# Patient Record
Sex: Male | Born: 1965 | Race: White | Hispanic: No | Marital: Single | Smoking: Current every day smoker
Health system: Southern US, Community
[De-identification: ages and names within clinical notes are randomized; demographics above are authoritative.]

## PROBLEM LIST (undated history)

## (undated) DIAGNOSIS — R110 Nausea: Secondary | ICD-10-CM

## (undated) DIAGNOSIS — C7951 Secondary malignant neoplasm of bone: Secondary | ICD-10-CM

## (undated) DIAGNOSIS — J302 Other seasonal allergic rhinitis: Secondary | ICD-10-CM

## (undated) DIAGNOSIS — M62838 Other muscle spasm: Secondary | ICD-10-CM

## (undated) DIAGNOSIS — G909 Disorder of the autonomic nervous system, unspecified: Secondary | ICD-10-CM

## (undated) DIAGNOSIS — K648 Other hemorrhoids: Principal | ICD-10-CM

## (undated) DIAGNOSIS — B20 Human immunodeficiency virus [HIV] disease: Secondary | ICD-10-CM

## (undated) DIAGNOSIS — C20 Malignant neoplasm of rectum: Secondary | ICD-10-CM

## (undated) DIAGNOSIS — K219 Gastro-esophageal reflux disease without esophagitis: Secondary | ICD-10-CM

## (undated) DIAGNOSIS — I214 Non-ST elevation (NSTEMI) myocardial infarction: Secondary | ICD-10-CM

## (undated) DIAGNOSIS — E876 Hypokalemia: Secondary | ICD-10-CM

## (undated) HISTORY — DX: Disorder of the autonomic nervous system, unspecified: G90.9

## (undated) HISTORY — DX: Human immunodeficiency virus (HIV) disease: B20

## (undated) HISTORY — DX: Nausea: R11.0

## (undated) HISTORY — DX: Other muscle spasm: M62.838

## (undated) HISTORY — DX: Non-ST elevation (NSTEMI) myocardial infarction: I21.4

## (undated) HISTORY — DX: Secondary malignant neoplasm of bone: C79.51

## (undated) HISTORY — DX: Other hemorrhoids: K64.8

## (undated) HISTORY — DX: Other seasonal allergic rhinitis: J30.2

## (undated) HISTORY — DX: Malignant neoplasm of rectum: C20

## (undated) HISTORY — DX: Hypokalemia: E87.6

## (undated) HISTORY — DX: Gastro-esophageal reflux disease without esophagitis: K21.9

---

## 1999-10-17 ENCOUNTER — Encounter: Admission: RE | Admit: 1999-10-17 | Discharge: 1999-10-17 | Payer: Self-pay | Admitting: Hematology and Oncology

## 1999-10-17 ENCOUNTER — Ambulatory Visit (HOSPITAL_COMMUNITY): Admission: RE | Admit: 1999-10-17 | Discharge: 1999-10-17 | Payer: Self-pay | Admitting: Hematology and Oncology

## 1999-11-05 ENCOUNTER — Ambulatory Visit (HOSPITAL_COMMUNITY): Admission: RE | Admit: 1999-11-05 | Discharge: 1999-11-05 | Payer: Self-pay | Admitting: Internal Medicine

## 1999-11-05 ENCOUNTER — Encounter: Admission: RE | Admit: 1999-11-05 | Discharge: 1999-11-05 | Payer: Self-pay | Admitting: Internal Medicine

## 1999-11-06 ENCOUNTER — Ambulatory Visit (HOSPITAL_COMMUNITY): Admission: RE | Admit: 1999-11-06 | Discharge: 1999-11-06 | Payer: Self-pay | Admitting: Internal Medicine

## 1999-11-06 ENCOUNTER — Encounter: Payer: Self-pay | Admitting: Internal Medicine

## 1999-12-05 ENCOUNTER — Encounter: Admission: RE | Admit: 1999-12-05 | Discharge: 1999-12-05 | Payer: Self-pay | Admitting: Internal Medicine

## 2000-01-24 ENCOUNTER — Encounter: Admission: RE | Admit: 2000-01-24 | Discharge: 2000-01-24 | Payer: Self-pay | Admitting: Internal Medicine

## 2000-01-24 ENCOUNTER — Ambulatory Visit (HOSPITAL_COMMUNITY): Admission: RE | Admit: 2000-01-24 | Discharge: 2000-01-24 | Payer: Self-pay | Admitting: Internal Medicine

## 2000-02-28 ENCOUNTER — Encounter: Admission: RE | Admit: 2000-02-28 | Discharge: 2000-02-28 | Payer: Self-pay | Admitting: Internal Medicine

## 2000-03-10 ENCOUNTER — Encounter: Admission: RE | Admit: 2000-03-10 | Discharge: 2000-03-10 | Payer: Self-pay | Admitting: Internal Medicine

## 2000-03-24 ENCOUNTER — Encounter: Admission: RE | Admit: 2000-03-24 | Discharge: 2000-03-24 | Payer: Self-pay | Admitting: Internal Medicine

## 2000-03-26 ENCOUNTER — Encounter: Admission: RE | Admit: 2000-03-26 | Discharge: 2000-03-26 | Payer: Self-pay | Admitting: Internal Medicine

## 2000-05-07 ENCOUNTER — Ambulatory Visit (HOSPITAL_COMMUNITY): Admission: RE | Admit: 2000-05-07 | Discharge: 2000-05-07 | Payer: Self-pay | Admitting: Internal Medicine

## 2000-05-07 ENCOUNTER — Encounter: Admission: RE | Admit: 2000-05-07 | Discharge: 2000-05-07 | Payer: Self-pay | Admitting: Internal Medicine

## 2000-08-10 ENCOUNTER — Emergency Department (HOSPITAL_COMMUNITY): Admission: EM | Admit: 2000-08-10 | Discharge: 2000-08-10 | Payer: Self-pay | Admitting: Emergency Medicine

## 2000-08-25 ENCOUNTER — Encounter: Admission: RE | Admit: 2000-08-25 | Discharge: 2000-08-25 | Payer: Self-pay | Admitting: Internal Medicine

## 2000-08-25 ENCOUNTER — Ambulatory Visit (HOSPITAL_COMMUNITY): Admission: RE | Admit: 2000-08-25 | Discharge: 2000-08-25 | Payer: Self-pay | Admitting: Internal Medicine

## 2015-07-05 ENCOUNTER — Other Ambulatory Visit: Payer: Self-pay

## 2015-07-10 ENCOUNTER — Other Ambulatory Visit: Payer: Self-pay

## 2015-07-10 DIAGNOSIS — B20 Human immunodeficiency virus [HIV] disease: Secondary | ICD-10-CM

## 2015-07-12 ENCOUNTER — Other Ambulatory Visit: Payer: Self-pay

## 2015-07-12 DIAGNOSIS — B20 Human immunodeficiency virus [HIV] disease: Secondary | ICD-10-CM

## 2015-07-12 LAB — CBC WITH DIFFERENTIAL/PLATELET
BASOS ABS: 0 {cells}/uL (ref 0–200)
Basophils Relative: 0 %
EOS PCT: 1 %
Eosinophils Absolute: 62 cells/uL (ref 15–500)
HCT: 44 % (ref 38.5–50.0)
Hemoglobin: 14.7 g/dL (ref 13.2–17.1)
LYMPHS PCT: 40 %
Lymphs Abs: 2480 cells/uL (ref 850–3900)
MCH: 30.8 pg (ref 27.0–33.0)
MCHC: 33.4 g/dL (ref 32.0–36.0)
MCV: 92.2 fL (ref 80.0–100.0)
MONOS PCT: 5 %
MPV: 12.4 fL (ref 7.5–12.5)
Monocytes Absolute: 310 cells/uL (ref 200–950)
NEUTROS PCT: 54 %
Neutro Abs: 3348 cells/uL (ref 1500–7800)
PLATELETS: 215 10*3/uL (ref 140–400)
RBC: 4.77 MIL/uL (ref 4.20–5.80)
RDW: 14.4 % (ref 11.0–15.0)
WBC: 6.2 10*3/uL (ref 3.8–10.8)

## 2015-07-12 LAB — COMPLETE METABOLIC PANEL WITH GFR
ALK PHOS: 54 U/L (ref 40–115)
ALT: 13 U/L (ref 9–46)
AST: 17 U/L (ref 10–40)
Albumin: 4.3 g/dL (ref 3.6–5.1)
BILIRUBIN TOTAL: 0.4 mg/dL (ref 0.2–1.2)
BUN: 9 mg/dL (ref 7–25)
CO2: 22 mmol/L (ref 20–31)
Calcium: 9.3 mg/dL (ref 8.6–10.3)
Chloride: 102 mmol/L (ref 98–110)
Creat: 0.86 mg/dL (ref 0.60–1.35)
GLUCOSE: 81 mg/dL (ref 65–99)
POTASSIUM: 3.9 mmol/L (ref 3.5–5.3)
SODIUM: 137 mmol/L (ref 135–146)
Total Protein: 6.9 g/dL (ref 6.1–8.1)

## 2015-07-12 LAB — LIPID PANEL
CHOL/HDL RATIO: 3.5 ratio (ref <=5.0)
Cholesterol: 180 mg/dL (ref 125–200)
HDL: 51 mg/dL (ref >=40)
LDL CALC: 102 mg/dL (ref <130)
Triglycerides: 137 mg/dL (ref <150)
VLDL: 27 mg/dL (ref <30)

## 2015-07-13 LAB — URINALYSIS
BILIRUBIN URINE: NEGATIVE
GLUCOSE, UA: NEGATIVE
HGB URINE DIPSTICK: NEGATIVE
KETONES UR: NEGATIVE
LEUKOCYTES UA: NEGATIVE
Nitrite: NEGATIVE
PH: 6 (ref 5.0–8.0)
Protein, ur: NEGATIVE
Specific Gravity, Urine: 1.012 (ref 1.001–1.035)

## 2015-07-13 LAB — HEPATITIS B SURFACE ANTIBODY,QUALITATIVE: Hep B S Ab: POSITIVE — AB

## 2015-07-13 LAB — HEPATITIS C ANTIBODY: HCV Ab: NEGATIVE

## 2015-07-13 LAB — HEPATITIS B CORE ANTIBODY, TOTAL: HEP B C TOTAL AB: REACTIVE — AB

## 2015-07-13 LAB — HEPATITIS A ANTIBODY, TOTAL: HEP A TOTAL AB: REACTIVE — AB

## 2015-07-13 LAB — T-HELPER CELL (CD4) - (RCID CLINIC ONLY)
CD4 % Helper T Cell: 33 % (ref 33–55)
CD4 T CELL ABS: 830 /uL (ref 400–2700)

## 2015-07-13 LAB — HEPATITIS B SURFACE ANTIGEN: Hepatitis B Surface Ag: NEGATIVE

## 2015-07-13 LAB — RPR

## 2015-07-14 LAB — QUANTIFERON TB GOLD ASSAY (BLOOD)
INTERFERON GAMMA RELEASE ASSAY: NEGATIVE
QUANTIFERON NIL VALUE: 0.06 [IU]/mL
QUANTIFERON TB AG MINUS NIL: 0.2 [IU]/mL

## 2015-07-17 LAB — URINE CYTOLOGY ANCILLARY ONLY
CHLAMYDIA, DNA PROBE: NEGATIVE
NEISSERIA GONORRHEA: NEGATIVE

## 2015-07-18 ENCOUNTER — Ambulatory Visit: Payer: Self-pay | Admitting: Infectious Diseases

## 2015-07-18 LAB — HIV-1 RNA ULTRAQUANT REFLEX TO GENTYP+
HIV 1 RNA Quant: <20 copies/mL (ref <20)
HIV-1 RNA Quant, Log: <1.3 Log copies/mL (ref <1.30)

## 2015-07-18 LAB — HLA B*5701: HLA-B*5701 w/rflx HLA-B High: NEGATIVE

## 2015-08-20 ENCOUNTER — Ambulatory Visit (INDEPENDENT_AMBULATORY_CARE_PROVIDER_SITE_OTHER): Payer: Self-pay | Admitting: Infectious Disease

## 2015-08-20 ENCOUNTER — Encounter: Payer: Self-pay | Admitting: Infectious Disease

## 2015-08-20 VITALS — BP 142/91 | HR 85 | Temp 98.3°F | Wt 180.0 lb

## 2015-08-20 DIAGNOSIS — E876 Hypokalemia: Secondary | ICD-10-CM

## 2015-08-20 DIAGNOSIS — B20 Human immunodeficiency virus [HIV] disease: Secondary | ICD-10-CM

## 2015-08-20 DIAGNOSIS — Z113 Encounter for screening for infections with a predominantly sexual mode of transmission: Secondary | ICD-10-CM

## 2015-08-20 DIAGNOSIS — M62838 Other muscle spasm: Secondary | ICD-10-CM

## 2015-08-20 DIAGNOSIS — J302 Other seasonal allergic rhinitis: Secondary | ICD-10-CM

## 2015-08-20 DIAGNOSIS — G909 Disorder of the autonomic nervous system, unspecified: Secondary | ICD-10-CM

## 2015-08-20 DIAGNOSIS — K219 Gastro-esophageal reflux disease without esophagitis: Secondary | ICD-10-CM

## 2015-08-20 HISTORY — DX: Other muscle spasm: M62.838

## 2015-08-20 HISTORY — DX: Other seasonal allergic rhinitis: J30.2

## 2015-08-20 HISTORY — DX: Disorder of the autonomic nervous system, unspecified: G90.9

## 2015-08-20 HISTORY — DX: Human immunodeficiency virus (HIV) disease: B20

## 2015-08-20 HISTORY — DX: Gastro-esophageal reflux disease without esophagitis: K21.9

## 2015-08-20 HISTORY — DX: Hypokalemia: E87.6

## 2015-08-20 LAB — CBC WITH DIFFERENTIAL/PLATELET
BASOS ABS: 0 {cells}/uL (ref 0–200)
Basophils Relative: 0 %
Eosinophils Absolute: 201 cells/uL (ref 15–500)
Eosinophils Relative: 3 %
HEMATOCRIT: 44.4 % (ref 38.5–50.0)
HEMOGLOBIN: 15.3 g/dL (ref 13.2–17.1)
LYMPHS ABS: 2479 {cells}/uL (ref 850–3900)
Lymphocytes Relative: 37 %
MCH: 31.2 pg (ref 27.0–33.0)
MCHC: 34.5 g/dL (ref 32.0–36.0)
MCV: 90.6 fL (ref 80.0–100.0)
MONO ABS: 402 {cells}/uL (ref 200–950)
MPV: 12.6 fL — ABNORMAL HIGH (ref 7.5–12.5)
Monocytes Relative: 6 %
NEUTROS PCT: 54 %
Neutro Abs: 3618 cells/uL (ref 1500–7800)
Platelets: 189 10*3/uL (ref 140–400)
RBC: 4.9 MIL/uL (ref 4.20–5.80)
RDW: 13.6 % (ref 11.0–15.0)
WBC: 6.7 10*3/uL (ref 3.8–10.8)

## 2015-08-20 LAB — COMPLETE METABOLIC PANEL WITH GFR
ALT: 14 U/L (ref 9–46)
AST: 19 U/L (ref 10–40)
Albumin: 4 g/dL (ref 3.6–5.1)
Alkaline Phosphatase: 43 U/L (ref 40–115)
BUN: 14 mg/dL (ref 7–25)
CHLORIDE: 106 mmol/L (ref 98–110)
CO2: 24 mmol/L (ref 20–31)
Calcium: 9.1 mg/dL (ref 8.6–10.3)
Creat: 0.82 mg/dL (ref 0.60–1.35)
GFR, Est African American: >89 mL/min (ref >=60)
GFR, Est Non African American: >89 mL/min (ref >=60)
GLUCOSE: 85 mg/dL (ref 65–99)
POTASSIUM: 4.2 mmol/L (ref 3.5–5.3)
SODIUM: 141 mmol/L (ref 135–146)
Total Bilirubin: 0.4 mg/dL (ref 0.2–1.2)
Total Protein: 6.4 g/dL (ref 6.1–8.1)

## 2015-08-20 MED ORDER — GABAPENTIN 600 MG PO TABS: 1200.0000 mg | ORAL_TABLET | Freq: Two times a day (BID) | ORAL | Status: DC

## 2015-08-20 MED ORDER — EMTRICITABINE-TENOFOVIR AF 200-25 MG PO TABS: 1.0000 | ORAL_TABLET | Freq: Every day | ORAL | Status: DC

## 2015-08-20 MED ORDER — DARUNAVIR-COBICISTAT 800-150 MG PO TABS: 1.0000 | ORAL_TABLET | Freq: Every day | ORAL | Status: DC

## 2015-08-20 NOTE — Patient Instructions (Signed)
We will do [place] if your ADAP is not done yet  You also need to renew the ADAP you just applied for since it is July  We will get blood work today  I want you to make an appt with [facility] or [place]) in 2 weeks  Repeat blood work in 4 weeks and appt to see me in 5-6 weeks

## 2015-08-20 NOTE — Progress Notes (Signed)
Chief complaint: establish care for his HIV disease here at [facility]  Subjective:    Patient ID: Kevin Conrad, male    DOB: 1966-01-31, 50 y.o.   MRN: [id number]  HPI  50 year old man with HIV disease, formerly AIDS who had been incarcerated for 18 years recently released from prison. He had been well controlled on Prezista, Norvir, Viread and 3TC x 10 years. He states that he was initially on AZT monotherapy, later boosted reyataz but he does not recall of his regimens. His VL was <20 5 days after running out of his meds.   Lab Results  Component Value Date   HIV1RNAQUANT <20 07/12/2015   Lab Results  Component Value Date   CD4TABS 830 07/12/2015   He has severe HIV neuropathy for which he take gabapentin 1200mg  BID.  Past Medical History  Diagnosis Date  . AIDS ([place]) 08/20/2015  . 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  No family history on file.    Social History   Social History  . Marital Status: Single    Spouse Name: N/A  . Number of Children: N/A  . Years of Education: N/A   Social History Main Topics  . Smoking status: Current Every Day Smoker -- 0.50 packs/day    Types: Cigarettes  . Smokeless tobacco: Not on file  . Alcohol Use: No  . Drug Use: No  . Sexual Activity: No     Comment: pt declined   Other Topics Concern  . Not on file   Social History Narrative  . No narrative on file    Allergies  Allergen Reactions  . Shellfish Allergy Anaphylaxis     Current outpatient prescriptions:  .  cetirizine (ZYRTEC) 10 MG tablet, Take 10 mg by mouth daily., Disp: , Rfl:  .  clobetasol cream (TEMOVATE) AB-123456789 %, Apply 1 application topically daily., Disp: , Rfl:  .  cyclobenzaprine (FLEXERIL) 5 MG tablet, Take 5 mg by mouth 3 (three) times daily., Disp: , Rfl:  .  furosemide (LASIX) 10 MG/ML solution, Take 20 mg by mouth daily., Disp: , Rfl:  .  gabapentin (NEURONTIN) 600 MG tablet, Take 2 tablets (1,200 mg  total) by mouth 2 (two) times daily., Disp: 120 tablet, Rfl: 4 .  lisinopril (PRINIVIL,ZESTRIL) 40 MG tablet, Take 40 mg by mouth daily., Disp: , Rfl:  .  omeprazole (PRILOSEC) 20 MG capsule, Take 20 mg by mouth daily., Disp: , Rfl:  .  potassium chloride (K-DUR,KLOR-CON) 10 MEQ tablet, Take 10 mEq by mouth once., Disp: , Rfl:  .  darunavir-cobicistat (PREZCOBIX) 800-150 MG tablet, Take 1 tablet by mouth daily., Disp: 30 tablet, Rfl: 11 .  emtricitabine-tenofovir AF (DESCOVY) 200-25 MG tablet, Take 1 tablet by mouth daily., Disp: 30 tablet, Rfl: 11    Review of Systems  Constitutional: Negative for fever, chills, diaphoresis, activity change, appetite change, fatigue and unexpected weight change.  HENT: Negative for congestion, rhinorrhea, sinus pressure, sneezing, sore throat and trouble swallowing.   Eyes: Negative for photophobia and visual disturbance.  Respiratory: Negative for cough, chest tightness, shortness of breath, wheezing and stridor.   Cardiovascular: Negative for chest pain, palpitations and leg swelling.  Gastrointestinal: Negative for nausea, vomiting, abdominal pain, diarrhea, constipation, blood in stool, abdominal distention and anal bleeding.  Genitourinary: Negative for dysuria, hematuria, flank pain and difficulty urinating.  Musculoskeletal: Negative for myalgias, back pain, joint swelling, arthralgias and gait problem.  Skin: Negative for color  change, pallor, rash and wound.  Neurological: Positive for numbness. Negative for dizziness, tremors, weakness and light-headedness.  Hematological: Negative for adenopathy. Does not bruise/bleed easily.  Psychiatric/Behavioral: Negative for behavioral problems, confusion, sleep disturbance, dysphoric mood, decreased concentration and agitation.       Objective:   Physical Exam  Constitutional: He is oriented to person, place, and time. He appears well-developed and well-nourished.  HENT:  Head: Normocephalic and  atraumatic.  Eyes: Conjunctivae and EOM are normal.  Neck: Normal range of motion. Neck supple.  Cardiovascular: Normal rate and regular rhythm.   Pulmonary/Chest: Effort normal. No respiratory distress. He has no wheezes.  Abdominal: Soft. He exhibits no distension.  Musculoskeletal: Normal range of motion. He exhibits no edema or tenderness.  Neurological: He is alert and oriented to person, place, and time.  Skin: Skin is warm and dry. No rash noted. No erythema. No pallor.  Psychiatric: He has a normal mood and affect. His behavior is normal. Judgment and thought content normal.          Assessment & Plan:   HIV disease:  I will place him on Prezcobix and Truvada until we can ensure ADAP renewal and change to Prezcobix plus Descovy  HIV neuropathy: continue gabapentin  GERD: continue PPI  I spent greater than 45 minutes with the patient including greater than 50% of time in face to face counsel of the patient re his HIV, his neuropathy, GERD and in coordination of his care.

## 2015-08-21 LAB — RPR

## 2015-08-22 LAB — T-HELPER CELL (CD4) - (RCID CLINIC ONLY)
CD4 % Helper T Cell: 34 % (ref 33–55)
CD4 T Cell Abs: 850 /uL (ref 400–2700)

## 2015-08-23 LAB — HIV-1 RNA ULTRAQUANT REFLEX TO GENTYP+
HIV 1 RNA QUANT: 36 {copies}/mL — AB (ref <20)
HIV-1 RNA QUANT, LOG: 1.56 {Log_copies}/mL — AB (ref <1.30)

## 2015-08-30 LAB — HLA B*5701: HLA-B*5701 w/rflx HLA-B High: NEGATIVE

## 2015-09-03 ENCOUNTER — Ambulatory Visit: Payer: Self-pay

## 2015-09-03 ENCOUNTER — Ambulatory Visit: Payer: Self-pay | Admitting: Pharmacist

## 2015-09-03 VITALS — Ht 65.0 in

## 2015-09-03 DIAGNOSIS — B20 Human immunodeficiency virus [HIV] disease: Secondary | ICD-10-CM

## 2015-09-03 MED ORDER — EMTRICITABINE-TENOFOVIR AF 200-25 MG PO TABS: 1.0000 | ORAL_TABLET | Freq: Every day | ORAL | Status: DC

## 2015-09-03 MED ORDER — DARUNAVIR-COBICISTAT 800-150 MG PO TABS: 1.0000 | ORAL_TABLET | Freq: Every day | ORAL | Status: DC

## 2015-09-03 MED ORDER — POTASSIUM CHLORIDE CRYS ER 10 MEQ PO TBCR: 10.0000 meq | EXTENDED_RELEASE_TABLET | Freq: Once | ORAL | Status: AC

## 2015-09-03 MED ORDER — GABAPENTIN 600 MG PO TABS: 1200.0000 mg | ORAL_TABLET | Freq: Two times a day (BID) | ORAL | Status: DC

## 2015-09-03 MED ORDER — LISINOPRIL 40 MG PO TABS: 40.0000 mg | ORAL_TABLET | Freq: Every day | ORAL | Status: DC

## 2015-09-03 MED ORDER — OMEPRAZOLE 20 MG PO CPDR: 20.0000 mg | DELAYED_RELEASE_CAPSULE | Freq: Every day | ORAL | Status: DC

## 2015-09-03 MED ORDER — FUROSEMIDE 10 MG/ML PO SOLN: 20.0000 mg | Freq: Every day | ORAL | Status: AC

## 2015-09-03 NOTE — Progress Notes (Signed)
HPI: Kevin Conrad is a 50 y.o. male who presents to the [facility] today for follow up of his HIV infection.  He was recently released from jail and has started Prezcobix + Descovy.  He is not having any issues tolerating his medications.  He did have nausea for around 10-12 days when first starting but it has resolved since then.  Allergies: Allergies  Allergen Reactions  . Shellfish Allergy Anaphylaxis    Vitals:    Past Medical History: Past Medical History  Diagnosis Date  . AIDS ([place]) 08/20/2015  . 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 GERD (gastroesophageal reflux disease) 08/20/2015  . Muscle spasm 08/20/2015  . Hypokalemia 08/20/2015  . Seasonal allergies 08/20/2015    Social History: Social History   Social History  . Marital Status: Single    Spouse Name: N/A  . Number of Children: N/A  . Years of Education: N/A   Social History Main Topics  . Smoking status: Current Every Day Smoker -- 0.50 packs/day    Types: Cigarettes  . Smokeless tobacco: Not on file  . Alcohol Use: No  . Drug Use: No  . Sexual Activity: No     Comment: pt declined   Other Topics Concern  . Not on file   Social History Narrative  . No narrative on file    Labs: HIV 1 RNA QUANT (copies/mL)  Date Value  08/20/2015 36*  07/12/2015 <20   CD4 T CELL ABS (/uL)  Date Value  08/20/2015 850  07/12/2015 830   HEP B S AB (no units)  Date Value  07/12/2015 POS*   HEPATITIS B SURFACE AG (no units)  Date Value  07/12/2015 NEGATIVE   HCV AB (no units)  Date Value  07/12/2015 NEGATIVE    CrCl: CrCl cannot be calculated (Unknown ideal weight.).  Lipids:    Component Value Date/Time   CHOL 180 07/12/2015 1628   TRIG 137 07/12/2015 1628   HDL 51 07/12/2015 1628   CHOLHDL 3.5 07/12/2015 1628   VLDL 27 07/12/2015 1628   LDLCALC 102 07/12/2015 1628    Assessment: Kevin Conrad is here today for follow up of his HIV.  He is on Prezcobix and Descovy.  He is  currently not having an issues since the nausea resolved after the first 1-2 weeks of taking the medications. He cannot remember the names of his HIV medications, but he can point them out to me on the chart.  He did ask where to pick up his medications because he will run out before seeing Dr. Tommy Medal again.  His ADAP was approved, so I sent his medications to [facility] on [address].  He has no other issues or questions for me.  Plans: - Continue Descovy 200-25 mg PO once daily - Continue Prezcobix 800-150 mg PO once daily - Will send your medications to [facility] on [address]  - Lab appointment 8/2 at 3:45pm - F/u appointment with Dr. Tommy Medal 8/16 at 4:15pm  Cassie L. Donnajean Lopes, PharmD [facility] [place] [facility] 09/03/2015, 4:46 PM

## 2015-09-06 ENCOUNTER — Encounter: Payer: Self-pay | Admitting: Infectious Disease

## 2015-09-19 ENCOUNTER — Encounter: Payer: Self-pay | Admitting: Licensed Clinical Social Worker

## 2015-09-19 ENCOUNTER — Other Ambulatory Visit: Payer: Self-pay

## 2015-09-20 ENCOUNTER — Other Ambulatory Visit (INDEPENDENT_AMBULATORY_CARE_PROVIDER_SITE_OTHER): Payer: Self-pay

## 2015-09-20 DIAGNOSIS — Z113 Encounter for screening for infections with a predominantly sexual mode of transmission: Secondary | ICD-10-CM

## 2015-09-20 DIAGNOSIS — B20 Human immunodeficiency virus [HIV] disease: Secondary | ICD-10-CM

## 2015-09-20 LAB — COMPLETE METABOLIC PANEL WITH GFR
ALBUMIN: 4.2 g/dL (ref 3.6–5.1)
ALK PHOS: 73 U/L (ref 40–115)
ALT: 13 U/L (ref 9–46)
AST: 17 U/L (ref 10–40)
BILIRUBIN TOTAL: 0.3 mg/dL (ref 0.2–1.2)
BUN: 16 mg/dL (ref 7–25)
CO2: 25 mmol/L (ref 20–31)
Calcium: 9.9 mg/dL (ref 8.6–10.3)
Chloride: 105 mmol/L (ref 98–110)
Creat: 1.01 mg/dL (ref 0.60–1.35)
GFR, EST NON AFRICAN AMERICAN: 88 mL/min (ref >=60)
GLUCOSE: 97 mg/dL (ref 65–99)
Potassium: 4.3 mmol/L (ref 3.5–5.3)
Sodium: 140 mmol/L (ref 135–146)
TOTAL PROTEIN: 6.8 g/dL (ref 6.1–8.1)

## 2015-09-20 LAB — CBC WITH DIFFERENTIAL/PLATELET
BASOS ABS: 0 {cells}/uL (ref 0–200)
Basophils Relative: 0 %
EOS ABS: 134 {cells}/uL (ref 15–500)
Eosinophils Relative: 2 %
HEMATOCRIT: 45.4 % (ref 38.5–50.0)
HEMOGLOBIN: 15.3 g/dL (ref 13.2–17.1)
LYMPHS ABS: 2278 {cells}/uL (ref 850–3900)
Lymphocytes Relative: 34 %
MCH: 30.8 pg (ref 27.0–33.0)
MCHC: 33.7 g/dL (ref 32.0–36.0)
MCV: 91.3 fL (ref 80.0–100.0)
MPV: 13 fL — ABNORMAL HIGH (ref 7.5–12.5)
Monocytes Absolute: 402 cells/uL (ref 200–950)
Monocytes Relative: 6 %
NEUTROS ABS: 3886 {cells}/uL (ref 1500–7800)
Neutrophils Relative %: 58 %
Platelets: 186 10*3/uL (ref 140–400)
RBC: 4.97 MIL/uL (ref 4.20–5.80)
RDW: 13.8 % (ref 11.0–15.0)
WBC: 6.7 10*3/uL (ref 3.8–10.8)

## 2015-09-21 LAB — URINE CYTOLOGY ANCILLARY ONLY
CHLAMYDIA, DNA PROBE: NEGATIVE
NEISSERIA GONORRHEA: NEGATIVE

## 2015-09-21 LAB — T-HELPER CELL (CD4) - (RCID CLINIC ONLY)
CD4 % Helper T Cell: 33 % (ref 33–55)
CD4 T Cell Abs: 790 /uL (ref 400–2700)

## 2015-09-21 LAB — RPR

## 2015-09-21 LAB — HIV-1 RNA ULTRAQUANT REFLEX TO GENTYP+
HIV 1 RNA Quant: <20 {copies}/mL
HIV-1 RNA Quant, Log: <1.3 {Log_copies}/mL

## 2015-10-03 ENCOUNTER — Ambulatory Visit (INDEPENDENT_AMBULATORY_CARE_PROVIDER_SITE_OTHER): Payer: Self-pay | Admitting: Infectious Disease

## 2015-10-03 ENCOUNTER — Encounter: Payer: Self-pay | Admitting: Infectious Disease

## 2015-10-03 VITALS — BP 134/88 | HR 88 | Temp 98.3°F | Wt 186.8 lb

## 2015-10-03 DIAGNOSIS — K4090 Unilateral inguinal hernia, without obstruction or gangrene, not specified as recurrent: Secondary | ICD-10-CM

## 2015-10-03 DIAGNOSIS — R197 Diarrhea, unspecified: Secondary | ICD-10-CM

## 2015-10-03 DIAGNOSIS — Z23 Encounter for immunization: Secondary | ICD-10-CM

## 2015-10-03 DIAGNOSIS — K219 Gastro-esophageal reflux disease without esophagitis: Secondary | ICD-10-CM

## 2015-10-03 DIAGNOSIS — G909 Disorder of the autonomic nervous system, unspecified: Secondary | ICD-10-CM

## 2015-10-03 DIAGNOSIS — B20 Human immunodeficiency virus [HIV] disease: Secondary | ICD-10-CM

## 2015-10-03 NOTE — Progress Notes (Signed)
Chief complaint: followup for his HIV disease  Subjective:    Patient ID: Kevin Conrad, male    DOB: 01-06-1966, 50 y.o.   MRN: [id number]  HPI  50 year old man with HIV disease, formerly AIDS who had been incarcerated for 18 years recently released from prison. He had been well controlled on Prezista, Norvir, Viread and 3TC x 10 years. He states that he was initially on AZT monotherapy, later boosted reyataz but he does not recall of his regimens. His VL was <20 5 days after running out of his meds. We placed him on Prezcobix and Truvada and then to Prezcobix and Descovy when his ADAP kicks in.   His VL is undetectable and CD4 healthy.  He has right sided hernia that hurts at night and he needs surgical repair. 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      Lab Results  Component Value Date   HIV1RNAQUANT <20 09/20/2015   HIV1RNAQUANT 36 (H) 08/20/2015   HIV1RNAQUANT <20 07/12/2015   Lab Results  Component Value Date   CD4TABS 790 09/20/2015   CD4TABS 850 08/20/2015   CD4TABS 830 07/12/2015   He has severe HIV neuropathy for which he take gabapentin 1200mg  BID.  Past Medical History:  Diagnosis Date  . AIDS ([place]) 08/20/2015  . GERD (gastroesophageal reflux disease) 08/20/2015  . 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 Hypokalemia 08/20/2015  . Muscle spasm 08/20/2015  . Seasonal allergies 08/20/2015    No past surgical history on file.  No family history on file.    Social History   Social History  . Marital status: Single    Spouse name: N/A  . Number of children: N/A  . Years of education: N/A   Social History Main Topics  . Smoking status: Current Every Day Smoker    Packs/day: 0.50    Types: Cigarettes  . Smokeless tobacco: Not on file  . Alcohol use No  . Drug use: No  . Sexual activity: No     Comment: pt declined   Other Topics Concern  . Not on file   Social History Narrative  . No narrative on file    Allergies  Allergen Reactions    . Shellfish Allergy Anaphylaxis     Current Outpatient Prescriptions:  .  cetirizine (ZYRTEC) 10 MG tablet, Take 10 mg by mouth daily., Disp: , Rfl:  .  clobetasol cream (TEMOVATE) AB-123456789 %, Apply 1 application topically daily., Disp: , Rfl:  .  cyclobenzaprine (FLEXERIL) 5 MG tablet, Take 5 mg by mouth 3 (three) times daily., Disp: , Rfl:  .  darunavir-cobicistat (PREZCOBIX) 800-150 MG tablet, Take 1 tablet by mouth daily., Disp: 30 tablet, Rfl: 11 .  emtricitabine-tenofovir AF (DESCOVY) 200-25 MG tablet, Take 1 tablet by mouth daily., Disp: 30 tablet, Rfl: 11 .  furosemide (LASIX) 10 MG/ML solution, Take 2 mLs (20 mg total) by mouth daily., Disp: 60 mL, Rfl: 1 .  gabapentin (NEURONTIN) 600 MG tablet, Take 2 tablets (1,200 mg total) by mouth 2 (two) times daily., Disp: 120 tablet, Rfl: 4 .  lisinopril (PRINIVIL,ZESTRIL) 40 MG tablet, Take 1 tablet (40 mg total) by mouth daily., Disp: 30 tablet, Rfl: 2 .  omeprazole (PRILOSEC) 20 MG capsule, Take 1 capsule (20 mg total) by mouth daily., Disp: 30 capsule, Rfl: 1 .  potassium chloride (K-DUR,KLOR-CON) 10 MEQ tablet, Take 1 tablet (10 mEq total) by mouth once., Disp: 30 tablet, Rfl: 1    Review of Systems  Constitutional: Negative for activity change, appetite change, chills, diaphoresis, fatigue, fever and unexpected weight change.  HENT: Negative for congestion, rhinorrhea, sinus pressure, sneezing, sore throat and trouble swallowing.   Eyes: Negative for photophobia and visual disturbance.  Respiratory: Negative for cough, chest tightness, shortness of breath, wheezing and stridor.   Cardiovascular: Negative for chest pain, palpitations and leg swelling.  Gastrointestinal: Positive for abdominal pain. Negative for abdominal distention, anal bleeding, blood in stool, constipation, diarrhea, nausea and vomiting.  Genitourinary: Negative for difficulty urinating, dysuria, flank pain and hematuria.  Musculoskeletal: Negative for arthralgias,  back pain, gait problem, joint swelling and myalgias.  Skin: Negative for color change, pallor, rash and wound.  Neurological: Negative for dizziness, tremors, weakness and light-headedness.  Hematological: Negative for adenopathy. Does not bruise/bleed easily.  Psychiatric/Behavioral: Negative for agitation, behavioral problems, confusion, decreased concentration, dysphoric mood and sleep disturbance.       Objective:   Physical Exam  Constitutional: He is oriented to person, place, and time. He appears well-developed and well-nourished.  HENT:  Head: Normocephalic and atraumatic.  Eyes: Conjunctivae and EOM are normal.  Neck: Normal range of motion. Neck supple.  Cardiovascular: Normal rate and regular rhythm.   Pulmonary/Chest: Effort normal. No respiratory distress. He has no wheezes.  Abdominal: Soft. He exhibits no distension.  Musculoskeletal: Normal range of motion. He exhibits no edema or tenderness.  Neurological: He is alert and oriented to person, place, and time.  Skin: Skin is warm and dry. No rash noted. No erythema. No pallor.  Psychiatric: He has a normal mood and affect. His behavior is normal. Judgment and thought content normal.    10/03/15: Hernia          Assessment & Plan:   HIV disease:  Continue Prezcobix plus Descovy, ADAP renewed, RTC in 2 months  HIV neuropathy: continue gabapentin  Hernia: try to get [facility] card vs referral to [facility]  Diarrhea: 2 loose BM per day and is going to get better with time.  GERD: continue PPI  I spent greater than 25 minutes with the patient including greater than 50% of time in face to face counsel of the patient re his HIV, his neuropathy, GERD and in coordination of his care.

## 2015-10-24 ENCOUNTER — Other Ambulatory Visit: Payer: Self-pay | Admitting: Infectious Disease

## 2015-11-15 ENCOUNTER — Telehealth: Payer: Self-pay

## 2015-11-15 NOTE — Telephone Encounter (Signed)
Patient calling with complaint of increased pain with neurologia .  He says it is more than he can bear.  We have not appointments available today and Dr Tommy Medal is out of the office.  Patient advised he should go to primary care or urgent care for evaluation.   Laverle Patter, RN

## 2015-11-20 ENCOUNTER — Other Ambulatory Visit: Payer: Self-pay | Admitting: Infectious Disease

## 2015-11-20 DIAGNOSIS — I10 Essential (primary) hypertension: Secondary | ICD-10-CM

## 2015-11-21 ENCOUNTER — Ambulatory Visit (INDEPENDENT_AMBULATORY_CARE_PROVIDER_SITE_OTHER): Payer: Self-pay

## 2015-11-21 DIAGNOSIS — Z23 Encounter for immunization: Secondary | ICD-10-CM

## 2015-11-28 ENCOUNTER — Telehealth: Payer: Self-pay | Admitting: *Deleted

## 2015-11-28 NOTE — Telephone Encounter (Signed)
Patient called [facility].  He described the following symptoms:  upset stomach for 2 weeks, chest congestion, and diarrhea.  He has tried over-the-counter medications without relief.  Patient is asking for suggestions of other OTC medications.  Unable to reach the patient by phone to respond to his question.   Patient does have an upcoming appointment with Dr. Tommy Medal on December 10, 2015.

## 2015-11-28 NOTE — Telephone Encounter (Signed)
Imodium can be taken for loose stools. He may have to wait for his appt

## 2015-12-04 ENCOUNTER — Ambulatory Visit: Payer: Self-pay | Admitting: Infectious Disease

## 2015-12-10 ENCOUNTER — Encounter: Payer: Self-pay | Admitting: Infectious Disease

## 2015-12-10 ENCOUNTER — Ambulatory Visit (INDEPENDENT_AMBULATORY_CARE_PROVIDER_SITE_OTHER): Payer: Self-pay | Admitting: Infectious Disease

## 2015-12-10 VITALS — BP 132/91 | HR 91 | Temp 98.8°F | Ht 67.5 in | Wt 205.0 lb

## 2015-12-10 DIAGNOSIS — G909 Disorder of the autonomic nervous system, unspecified: Secondary | ICD-10-CM

## 2015-12-10 DIAGNOSIS — K219 Gastro-esophageal reflux disease without esophagitis: Secondary | ICD-10-CM

## 2015-12-10 DIAGNOSIS — B20 Human immunodeficiency virus [HIV] disease: Secondary | ICD-10-CM

## 2015-12-10 MED ORDER — GABAPENTIN 600 MG PO TABS
1200.0000 mg | ORAL_TABLET | ORAL | 4 refills | Status: DC
Start: 2015-12-10 — End: 2016-04-09

## 2015-12-10 NOTE — Patient Instructions (Signed)
Meet with phamacy in next 2 weeks to review your gabapentin dose and escalate dose further vs go to differrent med such as lyrica  RTC in 4 months

## 2015-12-10 NOTE — Progress Notes (Signed)
Chief complaint: followup for his HIV disease, c/o painful neuropathy  Subjective:    Patient ID: Kevin Conrad, male    DOB: [birth year], 50 y.o.   MRN: [id number]  HPI  50 year old man with HIV disease, formerly AIDS who had been incarcerated for 18 years recently released from prison. He had been well controlled on Prezista, Norvir, Viread and 3TC x 10 years. He states that he was initially on AZT monotherapy, later boosted reyataz but he does not recall of his regimens. His VL was <20 5 days after running out of his meds. We placed him on Prezcobix and Truvada and then to Prezcobix and Descovy when his ADAP kicks in.   His VL is undetectable and CD4 healthy.  He has right sided hernia that hurts at night and he needs surgical repair. 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   I saw him in August and returns for followup today now c/o severe neuropathy that was not a prominent complaint when I last saw him.        Lab Results  Component Value Date   HIV1RNAQUANT <20 09/20/2015   HIV1RNAQUANT 36 (H) 08/20/2015   HIV1RNAQUANT <20 07/12/2015   Lab Results  Component Value Date   CD4TABS 790 09/20/2015   CD4TABS 850 08/20/2015   CD4TABS 830 07/12/2015   He has severe HIV neuropathy for which he take gabapentin 1200mg  BID.  Past Medical History:  Diagnosis Date  . AIDS ([place]) 08/20/2015  . GERD (gastroesophageal reflux disease) 08/20/2015  . 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 Hypokalemia 08/20/2015  . Muscle spasm 08/20/2015  . Seasonal allergies 08/20/2015    No past surgical history on file.  No family history on file.    Social History   Social History  . Marital status: Single    Spouse name: N/A  . Number of children: N/A  . Years of education: N/A   Social History Main Topics  . Smoking status: Current Every Day Smoker    Packs/day: 0.50    Years: 32.00    Types: Cigarettes  . Smokeless tobacco: Never Used  . Alcohol use No  . Drug use: No    . Sexual activity: No     Comment: pt declined   Other Topics Concern  . None   Social History Narrative  . None    Allergies  Allergen Reactions  . Shellfish Allergy Anaphylaxis  . Doxycycline Swelling    Hives, rash, itching and feels his throat swelling     Current Outpatient Prescriptions:  .  cetirizine (ZYRTEC) 10 MG tablet, Take 10 mg by mouth daily., Disp: , Rfl:  .  clobetasol cream (TEMOVATE) AB-123456789 %, Apply 1 application topically daily., Disp: , Rfl:  .  cyclobenzaprine (FLEXERIL) 5 MG tablet, Take 5 mg by mouth 3 (three) times daily., Disp: , Rfl:  .  darunavir-cobicistat (PREZCOBIX) 800-150 MG tablet, Take 1 tablet by mouth daily., Disp: 30 tablet, Rfl: 11 .  emtricitabine-tenofovir AF (DESCOVY) 200-25 MG tablet, Take 1 tablet by mouth daily., Disp: 30 tablet, Rfl: 11 .  furosemide (LASIX) 10 MG/ML solution, Take 2 mLs (20 mg total) by mouth daily., Disp: 60 mL, Rfl: 1 .  gabapentin (NEURONTIN) 600 MG tablet, Take 2 tablets (1,200 mg total) by mouth as directed., Disp: 180 tablet, Rfl: 4 .  lisinopril (PRINIVIL,ZESTRIL) 40 MG tablet, TAKE 1 TABLET(40 MG) BY MOUTH DAILY, Disp: 30 tablet, Rfl: 5 .  omeprazole (PRILOSEC) 20 MG capsule, TAKE  1 CAPSULE(20 MG) BY MOUTH DAILY, Disp: 30 capsule, Rfl: 5 .  potassium chloride (K-DUR,KLOR-CON) 10 MEQ tablet, Take 1 tablet (10 mEq total) by mouth once., Disp: 30 tablet, Rfl: 1    Review of Systems  Constitutional: Negative for activity change, appetite change, chills, diaphoresis, fatigue, fever and unexpected weight change.  HENT: Negative for congestion, rhinorrhea, sinus pressure, sneezing, sore throat and trouble swallowing.   Eyes: Negative for photophobia and visual disturbance.  Respiratory: Negative for cough, chest tightness, shortness of breath, wheezing and stridor.   Cardiovascular: Negative for chest pain, palpitations and leg swelling.  Gastrointestinal: Positive for abdominal pain. Negative for abdominal  distention, anal bleeding, blood in stool, constipation, diarrhea, nausea and vomiting.  Genitourinary: Negative for difficulty urinating, dysuria, flank pain and hematuria.  Musculoskeletal: Negative for arthralgias, back pain, gait problem, joint swelling and myalgias.  Skin: Negative for color change, pallor, rash and wound.  Neurological: Positive for numbness. Negative for dizziness, tremors, weakness and light-headedness.  Hematological: Negative for adenopathy. Does not bruise/bleed easily.  Psychiatric/Behavioral: Negative for agitation, behavioral problems, confusion, decreased concentration, dysphoric mood and sleep disturbance.       Objective:   Physical Exam  Constitutional: He is oriented to person, place, and time. He appears well-developed and well-nourished.  HENT:  Head: Normocephalic and atraumatic.  Eyes: Conjunctivae and EOM are normal.  Neck: Normal range of motion. Neck supple.  Cardiovascular: Normal rate and regular rhythm.   Pulmonary/Chest: Effort normal. No respiratory distress. He has no wheezes.  Abdominal: Soft. He exhibits no distension.  Musculoskeletal: Normal range of motion. He exhibits no edema or tenderness.  Neurological: He is alert and oriented to person, place, and time.  Skin: Skin is warm and dry. No rash noted. No erythema. No pallor.  Psychiatric: His behavior is normal. Judgment and thought content normal. He exhibits a depressed mood.    10/03/15: Hernia          Assessment & Plan:   HIV disease:  Continue Prezcobix plus Descovy, ADAP renewed, RTC when time to renew  HIV neuropathy: escalate dose of GAPAPENTIN by 600 mg and aim for 1200 mg TID  I will also check for comorbid B12, folate, TSH DM problems  Hernia: try to get [facility] card vs referral to [facility]. Need to bring up again   GERD: continue PPI  I spent greater than 25 minutes with the patient including greater than 50% of time in face to face counsel of the  patient re his HIV, his neuropathy, and in coordination of his care.

## 2015-12-17 ENCOUNTER — Ambulatory Visit: Payer: Self-pay | Admitting: Surgery

## 2015-12-17 NOTE — H&P (Signed)
[place] 12/17/2015 4:04 PM Location: [facility] Patient #: [id number] DOB: December 04, 1965 Single / Language: Kevin Conrad / Race: White Male  History of Present Illness Kevin Conrad; 12/17/2015 6:26 PM) The patient is a 50 year old male who presents with an inguinal hernia. Note for "Inguinal hernia": Patient sent for surgical consultation by [facility] emergency department. Kevin Conrad, Conrad. concern for worsening right groin hernia. Also gallstones.  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 Lucianne Lei Conrad with infectious disease in town. Has had a known right groin hernia for a while. Seen in August. Surgical consultation suggested. Seen earlier this month. Consultation more strongly recommended. Patient had worsening pain. [facility] emergency department [facility] last night. CT scan confirmed right inguinal hernia. Gallstones. Surgical consultation evaluated. As the patient's had more intense pain, he wished to be seen. We fit him in urgently today. Patient walk about 20 more minutes before he has to stop. He does smoke E cigarettes now. He normally can eat anything he wants. No postprandial nausea vomiting or abdominal pain. Some mild heartburn. Usually proton pump inhibitor handles that fine. We'll have a loose bowel movement in the morning but no severe bouts of constipation or diarrhea. He thinks he had an open left inguinal hernia repair in [place] the year 2000.  No personal nor family history of GI/colon cancer, inflammatory bowel disease, irritable bowel syndrome, allergy such as Celiac Sprue, dietary/dairy problems, colitis, ulcers nor gastritis. No recent sick contacts/gastroenteritis. No travel outside the country. No changes in diet. No dysphagia to solids or liquids. No hematochezia, hematemesis, coffee ground emesis. No evidence of prior gastric/peptic ulceration.   Other Problems Kevin Conrad, CMA; 12/17/2015 4:04  PM) Cholelithiasis Gastroesophageal Reflux Disease Inguinal Hernia  Past Surgical History Kevin Conrad, Conrad; 12/17/2015 4:30 PM) No pertinent past surgical history Open Inguinal Hernia Surgery - Left [facility] 2000. ?open LIH repair  Allergies Kevin Conrad, CMA; 12/17/2015 4:06 PM) Shellfish-derived Products Doxycycline *DERMATOLOGICALS*  Medication History (Kevin Conrad, CMA; 12/17/2015 4:07 PM) Descovy (200-25MG  Tablet, Oral) Active. Gabapentin (600MG  Tablet, Oral) Active. Lisinopril (40MG  Tablet, Oral) Active. Omeprazole (20MG  Capsule DR, Oral) Active. Prezcobix (800-150MG  Tablet, Oral) Active. ZyrTEC Allergy (10MG  Tablet, Oral) Active. Lasix (10MG /ML Solution, Injection) Active. Medications Reconciled  Social History Kevin Conrad, CMA; 12/17/2015 4:04 PM) Caffeine use Coffee. No alcohol use No drug use Tobacco use Current every day smoker.  Family History Kevin Conrad, Kevin Conrad; 12/17/2015 4:04 PM) Cerebrovascular Accident Kevin Conrad. Hypertension Kevin Conrad.     Review of Systems Kevin Conrad CMA; 12/17/2015 4:04 PM) General Not Present- Appetite Loss, Chills, Fatigue, Fever, Night Sweats, Weight Gain and Weight Loss. Skin Not Present- Change in Wart/Mole, Dryness, Hives, Jaundice, New Lesions, Non-Healing Wounds, Rash and Ulcer. HEENT Present- Seasonal Allergies and Wears glasses/contact lenses. Not Present- Earache, Hearing Loss, Hoarseness, Nose Bleed, Oral Ulcers, Ringing in the Ears, Sinus Pain, Sore Throat, Visual Disturbances and Yellow Eyes. Respiratory Not Present- Bloody sputum, Chronic Cough, Difficulty Breathing, Snoring and Wheezing. Breast Not Present- Breast Mass, Breast Pain, Nipple Discharge and Skin Changes. Cardiovascular Not Present- Chest Pain, Difficulty Breathing Lying Down, Leg Cramps, Palpitations, Rapid Heart Rate, Shortness of Breath and Swelling of Extremities. Gastrointestinal Present- Abdominal Pain, Bloating,  Excessive gas and Gets full quickly at meals. Not Present- Bloody Stool, Change in Bowel Habits, Chronic diarrhea, Constipation, Difficulty Swallowing, Hemorrhoids, Indigestion, Nausea, Rectal Pain and Vomiting. Male Genitourinary Not Present- Blood in Urine, Change in Urinary Stream, Frequency, Impotence, Nocturia, Painful Urination, Urgency and Urine Leakage. Musculoskeletal Not  Present- Back Pain, Joint Pain, Joint Stiffness, Muscle Pain, Muscle Weakness and Swelling of Extremities. Neurological Not Present- Decreased Memory, Fainting, Headaches, Numbness, Seizures, Tingling, Tremor, Trouble walking and Weakness. Psychiatric Not Present- Anxiety, Bipolar, Change in Sleep Pattern, Depression, Fearful and Frequent crying. Endocrine Not Present- Cold Intolerance, Excessive Hunger, Hair Changes, Heat Intolerance, Hot flashes and New Diabetes. Hematology Present- HIV. Not Present- Blood Thinners, Easy Bruising, Excessive bleeding, Gland problems and Persistent Infections.  Vitals (Kevin Conrad CMA; 12/17/2015 4:05 PM) 12/17/2015 4:05 PM Weight: 204 lb Height: 67in Body Surface Area: 2.04 m Body Mass Index: 31.95 kg/m  BP: 132/80 (Sitting, Left Arm, Standard)      Physical Exam Kevin Conrad; 12/17/2015 6:24 PM)  General Mental Status-Alert. General Appearance-Not in acute distress, Not Sickly. Orientation-Oriented X3. Hydration-Well hydrated. Voice-Normal.  Integumentary Global Assessment Upon inspection and palpation of skin surfaces of the - Axillae: non-tender, no inflammation or ulceration, no drainage. and Distribution of scalp and body hair is normal. General Characteristics Temperature - normal warmth is noted.  Head and Neck Head-normocephalic, atraumatic with no lesions or palpable masses. Face Global Assessment - atraumatic, no absence of expression. Neck Global Assessment - no abnormal movements, no bruit auscultated on the right, no bruit  auscultated on the left, no decreased range of motion, non-tender. Trachea-midline. Thyroid Gland Characteristics - non-tender.  Eye Eyeball - Left-Extraocular movements intact, No Nystagmus. Eyeball - Right-Extraocular movements intact, No Nystagmus. Cornea - Left-No Hazy. Cornea - Right-No Hazy. Sclera/Conjunctiva - Left-No scleral icterus, No Discharge. Sclera/Conjunctiva - Right-No scleral icterus, No Discharge. Pupil - Left-Direct reaction to light normal. Pupil - Right-Direct reaction to light normal.  ENMT Ears Pinna - Left - no drainage observed, no generalized tenderness observed. Right - no drainage observed, no generalized tenderness observed. Nose and Sinuses External Inspection of the Nose - no destructive lesion observed. Inspection of the nares - Left - quiet respiration. Right - quiet respiration. Mouth and Throat Lips - Upper Lip - no fissures observed, no pallor noted. Lower Lip - no fissures observed, no pallor noted. Nasopharynx - no discharge present. Oral Cavity/Oropharynx - Tongue - no dryness observed. Oral Mucosa - no cyanosis observed. Hypopharynx - no evidence of airway distress observed.  Chest and Lung Exam Inspection Movements - Normal and Symmetrical. Accessory muscles - No use of accessory muscles in breathing. Palpation Palpation of the chest reveals - Non-tender. Auscultation Breath sounds - Normal and Clear.  Cardiovascular Auscultation Rhythm - Regular. Murmurs & Other Heart Sounds - Auscultation of the heart reveals - No Murmurs and No Systolic Clicks.  Abdomen Inspection Inspection of the abdomen reveals - No Visible peristalsis and No Abnormal pulsations. Umbilicus - No Bleeding, No Urine drainage. Palpation/Percussion Palpation and Percussion of the abdomen reveal - Soft, Non Tender, No Rebound tenderness, No Rigidity (guarding) and No Cutaneous hyperesthesia. Note: Obese but soft. Probable small 5 mm hernia  periumbilically. No diastases recti. Nontender, nondistended. No guarding. No umbilical no other hernias  Male Genitourinary Sexual Maturity Tanner 5 - Adult hair pattern and Adult penile size and shape. Note: Obvious right groin bulge going to the top of the scrotum. C/W with sensitive but reducible inguinal hernia. Mild impulse on the left side but no major hernia. Normal external genitalia. Epididymi, testes, and spermatic cords normal without any masses.  Peripheral Vascular Upper Extremity Inspection - Left - No Cyanotic nailbeds, Not Ischemic. Right - No Cyanotic nailbeds, Not Ischemic.  Neurologic Neurologic evaluation reveals -normal attention span and ability to  concentrate, able to name objects and repeat phrases. Appropriate fund of knowledge , normal sensation and normal coordination. Mental Status Affect - not angry, not paranoid. Cranial Nerves-Normal Bilaterally. Gait-Normal.  Neuropsychiatric Mental status exam performed with findings of-able to articulate well with normal speech/language, rate, volume and coherence, thought content normal with ability to perform basic computations and apply abstract reasoning and no evidence of hallucinations, delusions, obsessions or homicidal/suicidal ideation.  Musculoskeletal Global Assessment Spine, Ribs and Pelvis - no instability, subluxation or laxity. Right Upper Extremity - no instability, subluxation or laxity.  Lymphatic Head & Neck  General Head & Neck Lymphatics: Bilateral - Description - No Localized lymphadenopathy. Axillary  General Axillary Region: Bilateral - Description - No Localized lymphadenopathy. Femoral & Inguinal  Generalized Femoral & Inguinal Lymphatics: Left - Description - No Localized lymphadenopathy. Right - Description - No Localized lymphadenopathy.    Assessment & Plan Kevin Conrad; 12/17/2015 6:23 PM)  RIGHT INGUINAL HERNIA (K40.90) Impression: Moderate sized sensitive  inguinal hernia on right side. Probable small recurred on the left.  It is been getting larger and more symptomatic. I think he would benefit from repair. I would do a laparoscopic approach given the potential bilateral nature and possible recurrent nature on the left side.  He is getting rather sore & sensitive with that. We'll try and do an more urgently if possible.  RECURRENT LEFT INGUINAL HERNIA (K40.91) Impression: Probable small recurrent left inguinal hernia. If detected, plan repair at the same time.  UMBILICAL HERNIA WITHOUT OBSTRUCTION AND WITHOUT GANGRENE (K42.9) Impression: Small but definite umbilical hernia. I suspect can be fixed with just primary suturing since its less than a centimeter in size. Planning periumbilical incision anyway.  PREOP - ING HERNIA - ENCOUNTER FOR PREOPERATIVE EXAMINATION FOR GENERAL SURGICAL PROCEDURE (Z01.818)  Current Plans You are being scheduled for surgery - Our schedulers will call you.  You should hear from our office's scheduling department within 5 working days about the location, date, and time of surgery. We try to make accommodations for patient's preferences in scheduling surgery, but sometimes the OR schedule or the surgeon's schedule prevents [place] from making those accommodations.  If you have not heard from our office [phone]) in 5 working days, call the office and ask for your surgeon's nurse.  If you have other questions about your diagnosis, plan, or surgery, call the office and ask for your surgeon's nurse.  Written instructions provided The anatomy & physiology of the abdominal wall and pelvic floor was discussed. The pathophysiology of hernias in the inguinal and pelvic region was discussed. Natural history risks such as progressive enlargement, pain, incarceration, and strangulation was discussed. Contributors to complications such as smoking, obesity, diabetes, prior surgery, etc were discussed.  I feel the risks of no  intervention will lead to serious problems that outweigh the operative risks; therefore, I recommended surgery to reduce and repair the hernia. I explained laparoscopic techniques with possible need for an open approach. I noted usual use of mesh to patch and/or buttress hernia repair  Risks such as bleeding, infection, abscess, need for further treatment, heart attack, death, and other risks were discussed. I noted a good likelihood this will help address the problem. Goals of post-operative recovery were discussed as well. Possibility that this will not correct all symptoms was explained. I stressed the importance of low-impact activity, aggressive pain control, avoiding constipation, & not pushing through pain to minimize risk of post-operative chronic pain or injury. Possibility of reherniation was discussed.  We will work to minimize complications.  An educational handout further explaining the pathology & treatment options was given as well. Questions were answered. The patient expresses understanding & wishes to proceed with surgery.  Pt Education - Pamphlet Given -  Kevin Conrad, M.D., F.A.C.S. Gastrointestinal and Minimally Invasive Surgery [facility] [address], [place], [place] [address] [phone] Main / Paging

## 2015-12-18 ENCOUNTER — Ambulatory Visit: Payer: Self-pay | Admitting: Surgery

## 2015-12-18 DIAGNOSIS — K4091 Unilateral inguinal hernia, without obstruction or gangrene, recurrent: Secondary | ICD-10-CM

## 2015-12-18 DIAGNOSIS — K429 Umbilical hernia without obstruction or gangrene: Secondary | ICD-10-CM | POA: Insufficient documentation

## 2015-12-18 DIAGNOSIS — K4090 Unilateral inguinal hernia, without obstruction or gangrene, not specified as recurrent: Secondary | ICD-10-CM

## 2015-12-18 NOTE — H&P (Signed)
[place] 12/17/2015 4:04 PM Location: [facility] Patient #: [id number] DOB: October 21, 1965 Single / Language: Cleophus Molt / Race: White Male  Patient Care Team: Marliss Coots, NP as PCP - General Michael Boston, MD as Consulting Physician (General Surgery) Truman Hayward, MD as Consulting Physician (Infectious Diseases)  Patient Active Problem List   Diagnosis Date Noted  . Inguinal hernia, right 12/18/2015  . Recurrent left inguinal hernia 12/18/2015  . Umbilical hernia [id number]  . AIDS ([place]) 08/20/2015  . 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 GERD (gastroesophageal reflux disease) 08/20/2015  . Muscle spasm 08/20/2015  . Hypokalemia 08/20/2015  . Seasonal allergies 08/20/2015     History of Present Illness Adin Hector MD; 12/17/2015 6:26 PM) The patient is a 50 year old male who presents with an inguinal hernia. Note for "Inguinal hernia": Patient sent for surgical consultation by [facility] emergency department. [name], MD. concern for worsening right groin hernia. Also gallstones.  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 Lucianne Lei dam with infectious disease in town. Has had a known right groin hernia for a while. Seen in August. Surgical consultation suggested. Seen earlier this month. Consultation more strongly recommended. Patient had worsening pain. [facility] emergency department [facility] last night. CT scan confirmed right inguinal hernia. Gallstones. Surgical consultation evaluated. As the patient's had more intense pain, he wished to be seen. We fit him in urgently today. Patient walk about 20 more minutes before he has to stop. He does smoke E cigarettes now. He normally can eat anything he wants. No postprandial nausea vomiting or abdominal pain. Some mild heartburn. Usually proton pump inhibitor handles that fine. We'll have a loose bowel movement in the morning but no severe bouts of constipation or diarrhea. He  thinks he had an open left inguinal hernia repair in [place] the year 2000.  No personal nor family history of GI/colon cancer, inflammatory bowel disease, irritable bowel syndrome, allergy such as Celiac Sprue, dietary/dairy problems, colitis, ulcers nor gastritis. No recent sick contacts/gastroenteritis. No travel outside the country. No changes in diet. No dysphagia to solids or liquids. No hematochezia, hematemesis, coffee ground emesis. No evidence of prior gastric/peptic ulceration.   Other Problems [name], CMA; 12/17/2015 4:04 PM) Cholelithiasis Gastroesophageal Reflux Disease Inguinal Hernia  Past Surgical History Adin Hector, MD; 12/17/2015 4:30 PM) No pertinent past surgical history Open Inguinal Hernia Surgery - Left [facility] 2000. ?open LIH repair  Allergies Lars Mage Spillers, CMA; 12/17/2015 4:06 PM) Shellfish-derived Products Doxycycline *DERMATOLOGICALS*  Medication History (Alisha Spillers, CMA; 12/17/2015 4:07 PM) Descovy (200-25MG  Tablet, Oral) Active. Gabapentin (600MG  Tablet, Oral) Active. Lisinopril (40MG  Tablet, Oral) Active. Omeprazole (20MG  Capsule DR, Oral) Active. Prezcobix (800-150MG  Tablet, Oral) Active. ZyrTEC Allergy (10MG  Tablet, Oral) Active. Lasix (10MG /ML Solution, Injection) Active. Medications Reconciled  Social History [name], CMA; 12/17/2015 4:04 PM) Caffeine use Coffee. No alcohol use No drug use Tobacco use Current every day smoker.  Family History [name], [place]; 12/17/2015 4:04 PM) Cerebrovascular Accident Father. Hypertension Father.    Review of Systems Lars Mage Spillers CMA; 12/17/2015 4:04 PM) General Not Present- Appetite Loss, Chills, Fatigue, Fever, Night Sweats, Weight Gain and Weight Loss. Skin Not Present- Change in Wart/Mole, Dryness, Hives, Jaundice, New Lesions, Non-Healing Wounds, Rash and Ulcer. HEENT Present- Seasonal Allergies and Wears glasses/contact lenses. Not  Present- Earache, Hearing Loss, Hoarseness, Nose Bleed, Oral Ulcers, Ringing in the Ears, Sinus Pain, Sore Throat, Visual Disturbances and Yellow Eyes. Respiratory Not Present- Bloody sputum, Chronic Cough,  Difficulty Breathing, Snoring and Wheezing. Breast Not Present- Breast Mass, Breast Pain, Nipple Discharge and Skin Changes. Cardiovascular Not Present- Chest Pain, Difficulty Breathing Lying Down, Leg Cramps, Palpitations, Rapid Heart Rate, Shortness of Breath and Swelling of Extremities. Gastrointestinal Present- Abdominal Pain, Bloating, Excessive gas and Gets full quickly at meals. Not Present- Bloody Stool, Change in Bowel Habits, Chronic diarrhea, Constipation, Difficulty Swallowing, Hemorrhoids, Indigestion, Nausea, Rectal Pain and Vomiting. Male Genitourinary Not Present- Blood in Urine, Change in Urinary Stream, Frequency, Impotence, Nocturia, Painful Urination, Urgency and Urine Leakage. Musculoskeletal Not Present- Back Pain, Joint Pain, Joint Stiffness, Muscle Pain, Muscle Weakness and Swelling of Extremities. Neurological Not Present- Decreased Memory, Fainting, Headaches, Numbness, Seizures, Tingling, Tremor, Trouble walking and Weakness. Psychiatric Not Present- Anxiety, Bipolar, Change in Sleep Pattern, Depression, Fearful and Frequent crying. Endocrine Not Present- Cold Intolerance, Excessive Hunger, Hair Changes, Heat Intolerance, Hot flashes and New Diabetes. Hematology Present- HIV. Not Present- Blood Thinners, Easy Bruising, Excessive bleeding, Gland problems and Persistent Infections.  Vitals (Alisha Spillers CMA; 12/17/2015 4:05 PM) 12/17/2015 4:05 PM Weight: 204 lb Height: 67in Body Surface Area: 2.04 m Body Mass Index: 31.95 kg/m  BP: 132/80 (Sitting, Left Arm, Standard)       Physical Exam Adin Hector MD; 12/17/2015 6:24 PM) General Mental Status-Alert. General Appearance-Not in acute distress, Not Sickly. Orientation-Oriented  X3. Hydration-Well hydrated. Voice-Normal.  Integumentary Global Assessment Upon inspection and palpation of skin surfaces of the - Axillae: non-tender, no inflammation or ulceration, no drainage. and Distribution of scalp and body hair is normal. General Characteristics Temperature - normal warmth is noted.  Head and Neck Head-normocephalic, atraumatic with no lesions or palpable masses. Face Global Assessment - atraumatic, no absence of expression. Neck Global Assessment - no abnormal movements, no bruit auscultated on the right, no bruit auscultated on the left, no decreased range of motion, non-tender. Trachea-midline. Thyroid Gland Characteristics - non-tender.  Eye Eyeball - Left-Extraocular movements intact, No Nystagmus. Eyeball - Right-Extraocular movements intact, No Nystagmus. Cornea - Left-No Hazy. Cornea - Right-No Hazy. Sclera/Conjunctiva - Left-No scleral icterus, No Discharge. Sclera/Conjunctiva - Right-No scleral icterus, No Discharge. Pupil - Left-Direct reaction to light normal. Pupil - Right-Direct reaction to light normal.  ENMT Ears Pinna - Left - no drainage observed, no generalized tenderness observed. Right - no drainage observed, no generalized tenderness observed. Nose and Sinuses External Inspection of the Nose - no destructive lesion observed. Inspection of the nares - Left - quiet respiration. Right - quiet respiration. Mouth and Throat Lips - Upper Lip - no fissures observed, no pallor noted. Lower Lip - no fissures observed, no pallor noted. Nasopharynx - no discharge present. Oral Cavity/Oropharynx - Tongue - no dryness observed. Oral Mucosa - no cyanosis observed. Hypopharynx - no evidence of airway distress observed.  Chest and Lung Exam Inspection Movements - Normal and Symmetrical. Accessory muscles - No use of accessory muscles in breathing. Palpation Palpation of the chest reveals -  Non-tender. Auscultation Breath sounds - Normal and Clear.  Cardiovascular Auscultation Rhythm - Regular. Murmurs & Other Heart Sounds - Auscultation of the heart reveals - No Murmurs and No Systolic Clicks.  Abdomen Inspection Inspection of the abdomen reveals - No Visible peristalsis and No Abnormal pulsations. Umbilicus - No Bleeding, No Urine drainage. Palpation/Percussion Palpation and Percussion of the abdomen reveal - Soft, Non Tender, No Rebound tenderness, No Rigidity (guarding) and No Cutaneous hyperesthesia. Note: Obese but soft. Probable small 5 mm hernia periumbilically. No diastases recti. Nontender, nondistended. No guarding. No  umbilical no other hernias   Male Genitourinary Sexual Maturity Tanner 5 - Adult hair pattern and Adult penile size and shape. Note: Obvious right groin bulge going to the top of the scrotum. C/W with sensitive but reducible inguinal hernia. Mild impulse on the left side but no major hernia. Normal external genitalia. Epididymi, testes, and spermatic cords normal without any masses.   Peripheral Vascular Upper Extremity Inspection - Left - No Cyanotic nailbeds, Not Ischemic. Right - No Cyanotic nailbeds, Not Ischemic.  Neurologic Neurologic evaluation reveals -normal attention span and ability to concentrate, able to name objects and repeat phrases. Appropriate fund of knowledge , normal sensation and normal coordination. Mental Status Affect - not angry, not paranoid. Cranial Nerves-Normal Bilaterally. Gait-Normal.  Neuropsychiatric Mental status exam performed with findings of-able to articulate well with normal speech/language, rate, volume and coherence, thought content normal with ability to perform basic computations and apply abstract reasoning and no evidence of hallucinations, delusions, obsessions or homicidal/suicidal ideation.  Musculoskeletal Global Assessment Spine, Ribs and Pelvis - no instability, subluxation  or laxity. Right Upper Extremity - no instability, subluxation or laxity.  Lymphatic Head & Neck  General Head & Neck Lymphatics: Bilateral - Description - No Localized lymphadenopathy. Axillary  General Axillary Region: Bilateral - Description - No Localized lymphadenopathy. Femoral & Inguinal  Generalized Femoral & Inguinal Lymphatics: Left - Description - No Localized lymphadenopathy. Right - Description - No Localized lymphadenopathy.    Assessment & Plan Adin Hector MD; 12/17/2015 6:27 PM) RIGHT INGUINAL HERNIA (K40.90) Impression: Moderate sized sensitive inguinal hernia on right side. Probable small recurred on the left.  It is been getting larger and more symptomatic. I think he would benefit from repair. I would do a laparoscopic approach given the potential bilateral nature and possible recurrent nature on the left side.  He is getting rather sore & sensitive with that. We'll try and do an more urgently if possible. RECURRENT LEFT INGUINAL HERNIA (K40.91) Impression: Probable small recurrent left inguinal hernia. If detected, plan repair at the same time. UMBILICAL HERNIA WITHOUT OBSTRUCTION AND WITHOUT GANGRENE (K42.9) Impression: Small but definite umbilical hernia. I suspect can be fixed with just primary suturing since its less than a centimeter in size. Planning periumbilical incision anyway. PREOP - ING HERNIA - ENCOUNTER FOR PREOPERATIVE EXAMINATION FOR GENERAL SURGICAL PROCEDURE (Z01.818) Current Plans You are being scheduled for surgery - Our schedulers will call you.  You should hear from our office's scheduling department within 5 working days about the location, date, and time of surgery. We try to make accommodations for patient's preferences in scheduling surgery, but sometimes the OR schedule or the surgeon's schedule prevents [place] from making those accommodations.  If you have not heard from our office [phone]) in 5 working days, call the office and  ask for your surgeon's nurse.  If you have other questions about your diagnosis, plan, or surgery, call the office and ask for your surgeon's nurse.  Written instructions provided The anatomy & physiology of the abdominal wall and pelvic floor was discussed. The pathophysiology of hernias in the inguinal and pelvic region was discussed. Natural history risks such as progressive enlargement, pain, incarceration, and strangulation was discussed. Contributors to complications such as smoking, obesity, diabetes, prior surgery, etc were discussed.  I feel the risks of no intervention will lead to serious problems that outweigh the operative risks; therefore, I recommended surgery to reduce and repair the hernia. I explained laparoscopic techniques with possible need for an open approach.  I noted usual use of mesh to patch and/or buttress hernia repair  Risks such as bleeding, infection, abscess, need for further treatment, heart attack, death, and other risks were discussed. I noted a good likelihood this will help address the problem. Goals of post-operative recovery were discussed as well. Possibility that this will not correct all symptoms was explained. I stressed the importance of low-impact activity, aggressive pain control, avoiding constipation, & not pushing through pain to minimize risk of post-operative chronic pain or injury. Possibility of reherniation was discussed. We will work to minimize complications.  An educational handout further explaining the pathology & treatment options was given as well. Questions were answered. The patient expresses understanding & wishes to proceed with surgery.  Pt Education - Pamphlet Given - Laparoscopic Hernia Repair: discussed with patient and provided information. Pt Education - [facility] Pain Control (Lounette Sloan) Pt Education - [facility] Hernia Post-Op HCI (Amel Kitch): discussed with patient and provided information. GALLSTONES (K80.20) Impression: I think these are  incidental on CAT scan. He has no evidence of biliary colic. There is no evidence of cholecystitis. I do not think any intervention is warranted.  Adin Hector, M.D., F.A.C.S. Gastrointestinal and Minimally Invasive Surgery [facility] [address] [place],  [address] [phone] Main / Paging

## 2015-12-24 ENCOUNTER — Ambulatory Visit (INDEPENDENT_AMBULATORY_CARE_PROVIDER_SITE_OTHER): Payer: Self-pay | Admitting: Pharmacist Clinician (PhC)/ Clinical Pharmacy Specialist

## 2015-12-24 DIAGNOSIS — B20 Human immunodeficiency virus [HIV] disease: Secondary | ICD-10-CM

## 2015-12-24 DIAGNOSIS — G909 Disorder of the autonomic nervous system, unspecified: Secondary | ICD-10-CM

## 2015-12-24 MED ORDER — EMTRICITABINE-TENOFOVIR AF 200-25 MG PO TABS: 1.0000 | ORAL_TABLET | Freq: Every day | ORAL | 6 refills | Status: DC

## 2015-12-24 MED ORDER — DOLUTEGRAVIR SODIUM 50 MG PO TABS: 50.0000 mg | ORAL_TABLET | Freq: Every day | ORAL | 6 refills | Status: DC

## 2015-12-24 NOTE — Patient Instructions (Addendum)
Take Tylenol 500mg  three times a day with your Neurontin to help with neuropathy Continue Descovy 1 tablet daily Stop Prezcobix Start Tivicay 1 tablet daily See me back on Dec 20th

## 2015-12-24 NOTE — Progress Notes (Signed)
Patient ID: Kevin Conrad, male   DOB: February 18, 1965, 50 y.o.   MRN: [id number] HPI: Kevin Conrad is a 50 y.o. male who is here to f/u with pharmacy to assess his neuropathy.   Allergies: Allergies  Allergen Reactions  . Shellfish Allergy Anaphylaxis  . Doxycycline Swelling    Hives, rash, itching and feels his throat swelling    Vitals:    Past Medical History: Past Medical History:  Diagnosis Date  . AIDS ([place]) 08/20/2015  . GERD (gastroesophageal reflux disease) 08/20/2015  . 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 Hypokalemia 08/20/2015  . Muscle spasm 08/20/2015  . Seasonal allergies 08/20/2015    Social History: Social History   Social History  . Marital status: Single    Spouse name: N/A  . Number of children: N/A  . Years of education: N/A   Social History Main Topics  . Smoking status: Current Every Day Smoker    Packs/day: 0.50    Years: 32.00    Types: Cigarettes  . Smokeless tobacco: Never Used  . Alcohol use No  . Drug use: No  . Sexual activity: No     Comment: pt declined   Other Topics Concern  . Not on file   Social History Narrative  . No narrative on file    Previous Regimen: AZT monotherapy, DRV/r/TDF/3TC  Current Regimen: Prezcobix/Descovy  Labs: HIV 1 RNA Quant (copies/mL)  Date Value  09/20/2015 <20  08/20/2015 36 (H)  07/12/2015 <20   CD4 T Cell Abs (/uL)  Date Value  09/20/2015 790  08/20/2015 850  07/12/2015 830   Hep B S Ab (no units)  Date Value  07/12/2015 POS (A)   Hepatitis B Surface Ag (no units)  Date Value  07/12/2015 NEGATIVE   HCV Ab (no units)  Date Value  07/12/2015 NEGATIVE    CrCl: CrCl cannot be calculated (Patient's most recent lab result is older than the maximum 21 days allowed.).  Lipids:    Component Value Date/Time   CHOL 180 07/12/2015 1628   TRIG 137 07/12/2015 1628   HDL 51 07/12/2015 1628   CHOLHDL 3.5 07/12/2015 1628   VLDL 27 07/12/2015 1628   LDLCALC 102 07/12/2015  1628    Assessment: Kevin Conrad was dx with HIV a long time ago. He was on some early regimen for treating HIV including monotherapy with AZT. He is here today today for follow up of his neuropathy management. He is currently on Neurontin 1200mg  TID. I was increase from BID recently. According to him, his neuropathy has decreased from a score of 8 to about 4 right now. I thought about adding Cymbalta to get some additive effect, however, he stated that when he as on it in the past, it gave him some suicidal ideation. Therefore, that is out the window. He is on a MAX dose oc gabapentin now so we can't do much with it. Advised him to take schedule APAP TID to get some effect for his neuropathy. He agreed to do it.   His HIV is relatively well controlled. However, he has chronic diarrhea for a while. Imodium is not helping that much. His sister told him to stop his ART about 3 days ago to see if it's medication related. He said the diarrhea is much improved. Stressed to him that this is very dangerous to do and not to do in the future. He said it started after he was put on this regimen. I suspect it's due to Prezcobix. He  was a little hesitant to change therapy but I told him that the side effects will continue to be a problem. He finally agreed to try a new regimen. He said that he has always been very compliance with meds. We don't have genotype data for his hx. I think it's reasonable to do Tivicay + Descovy.  Another issue is that he was recently eval for his hernia issue by Dr. Johney Maine. It was recommend for a hernia repair. However, he stated that the nurse called him that he would have to pay ~$2400. I'm not sure about the orange card vs referral to [facility]. Will have to ask Adrienne from [facility] about his case.   Recommendations:  Change Prezcobix to Tivicay 50mg  PO qday Cont Descovy 1 PO qday Cont Neurontin 1200mg  TID Start Tylenol 500mg  PO TID F/u with Vincente Liberty and Dr. Tommy Medal about the surgery  issue F/u with me in Dec to do HIV VL to assess regimen  Spent a good 40 mins with pt  Wilfred Lacy, PharmD, BCPS, AAHIVP, CPP Clinical [facility]ious [place] [facility] 12/24/2015, 9:06 PM

## 2016-01-25 ENCOUNTER — Emergency Department (HOSPITAL_COMMUNITY): Payer: Self-pay

## 2016-01-25 ENCOUNTER — Ambulatory Visit (HOSPITAL_COMMUNITY)
Admission: EM | Admit: 2016-01-25 | Discharge: 2016-01-25 | Disposition: A | Discharge status: Nursing Home (Includes ICF) | Payer: Self-pay | Attending: Family Medicine | Admitting: Family Medicine

## 2016-01-25 ENCOUNTER — Encounter (HOSPITAL_COMMUNITY): Payer: Self-pay | Admitting: Emergency Medicine

## 2016-01-25 ENCOUNTER — Encounter (HOSPITAL_COMMUNITY): Payer: Self-pay

## 2016-01-25 ENCOUNTER — Emergency Department (HOSPITAL_COMMUNITY)
Admission: EM | Admit: 2016-01-25 | Discharge: 2016-01-25 | Disposition: A | Discharge status: Home / Self Care | Payer: Self-pay | Attending: Emergency Medicine | Admitting: Emergency Medicine

## 2016-01-25 DIAGNOSIS — B370 Candidal stomatitis: Secondary | ICD-10-CM | POA: Insufficient documentation

## 2016-01-25 DIAGNOSIS — R197 Diarrhea, unspecified: Secondary | ICD-10-CM

## 2016-01-25 DIAGNOSIS — F17210 Nicotine dependence, cigarettes, uncomplicated: Secondary | ICD-10-CM | POA: Insufficient documentation

## 2016-01-25 DIAGNOSIS — R509 Fever, unspecified: Secondary | ICD-10-CM

## 2016-01-25 DIAGNOSIS — J029 Acute pharyngitis, unspecified: Secondary | ICD-10-CM

## 2016-01-25 DIAGNOSIS — B20 Human immunodeficiency virus [HIV] disease: Secondary | ICD-10-CM

## 2016-01-25 DIAGNOSIS — Z8619 Personal history of other infectious and parasitic diseases: Secondary | ICD-10-CM

## 2016-01-25 LAB — CBC WITH DIFFERENTIAL/PLATELET
Basophils Absolute: 0 10*3/uL (ref 0.0–0.1)
Basophils Relative: 0 %
EOS ABS: 0.1 10*3/uL (ref 0.0–0.7)
EOS PCT: 0 %
HCT: 45 % (ref 39.0–52.0)
Hemoglobin: 15.5 g/dL (ref 13.0–17.0)
LYMPHS ABS: 1.8 10*3/uL (ref 0.7–4.0)
Lymphocytes Relative: 13 %
MCH: 31.3 pg (ref 26.0–34.0)
MCHC: 34.4 g/dL (ref 30.0–36.0)
MCV: 90.9 fL (ref 78.0–100.0)
MONOS PCT: 9 %
Monocytes Absolute: 1.1 10*3/uL — ABNORMAL HIGH (ref 0.1–1.0)
Neutro Abs: 10.1 10*3/uL — ABNORMAL HIGH (ref 1.7–7.7)
Neutrophils Relative %: 78 %
PLATELETS: 183 10*3/uL (ref 150–400)
RBC: 4.95 MIL/uL (ref 4.22–5.81)
RDW: 14.2 % (ref 11.5–15.5)
WBC: 13.1 10*3/uL — ABNORMAL HIGH (ref 4.0–10.5)

## 2016-01-25 LAB — RAPID STREP SCREEN (MED CTR MEBANE ONLY): Streptococcus, Group A Screen (Direct): NEGATIVE

## 2016-01-25 LAB — COMPREHENSIVE METABOLIC PANEL
ALBUMIN: 3.9 g/dL (ref 3.5–5.0)
ALK PHOS: 49 U/L (ref 38–126)
ALT: 17 U/L (ref 17–63)
AST: 21 U/L (ref 15–41)
Anion gap: 11 (ref 5–15)
BUN: 15 mg/dL (ref 6–20)
CALCIUM: 8.4 mg/dL — AB (ref 8.9–10.3)
CO2: 20 mmol/L — AB (ref 22–32)
CREATININE: 1.11 mg/dL (ref 0.61–1.24)
Chloride: 105 mmol/L (ref 101–111)
GFR calc non Af Amer: >60 mL/min (ref >60)
GLUCOSE: 116 mg/dL — AB (ref 65–99)
Potassium: 3.6 mmol/L (ref 3.5–5.1)
SODIUM: 136 mmol/L (ref 135–145)
Total Bilirubin: 0.8 mg/dL (ref 0.3–1.2)
Total Protein: 7 g/dL (ref 6.5–8.1)

## 2016-01-25 LAB — URINALYSIS, ROUTINE W REFLEX MICROSCOPIC
BILIRUBIN URINE: NEGATIVE
Glucose, UA: NEGATIVE mg/dL
HGB URINE DIPSTICK: NEGATIVE
Ketones, ur: NEGATIVE mg/dL
Leukocytes, UA: NEGATIVE
Nitrite: NEGATIVE
PROTEIN: NEGATIVE mg/dL
SPECIFIC GRAVITY, URINE: 1.026 (ref 1.005–1.030)
pH: 5 (ref 5.0–8.0)

## 2016-01-25 LAB — I-STAT CG4 LACTIC ACID, ED
LACTIC ACID, VENOUS: 0.62 mmol/L (ref 0.5–1.9)
Lactic Acid, Venous: 0.72 mmol/L (ref 0.5–1.9)

## 2016-01-25 LAB — POCT RAPID STREP A: Streptococcus, Group A Screen (Direct): NEGATIVE

## 2016-01-25 MED ORDER — ALUM & MAG HYDROXIDE-SIMETH 200-200-20 MG/5ML PO SUSP
15.0000 mL | Freq: Once | ORAL | Status: AC
  Administered 2016-01-25: 15 mL via ORAL
  Filled 2016-01-25 (×2): qty 30

## 2016-01-25 MED ORDER — ONDANSETRON 4 MG PO TBDP: 4.0000 mg | ORAL_TABLET | Freq: Three times a day (TID) | ORAL | 0 refills | Status: AC | PRN

## 2016-01-25 MED ORDER — ONDANSETRON HCL 4 MG/2ML IJ SOLN
4.0000 mg | Freq: Once | INTRAMUSCULAR | Status: AC
  Administered 2016-01-25: 4 mg via INTRAVENOUS
  Filled 2016-01-25: qty 2

## 2016-01-25 MED ORDER — SODIUM CHLORIDE 0.9 % IV BOLUS (SEPSIS)
1000.0000 mL | Freq: Once | INTRAVENOUS | Status: AC
  Administered 2016-01-25: 1000 mL via INTRAVENOUS

## 2016-01-25 MED ORDER — IBUPROFEN 400 MG PO TABS
600.0000 mg | ORAL_TABLET | Freq: Once | ORAL | Status: AC
  Administered 2016-01-25: 17:00:00 600 mg via ORAL
  Filled 2016-01-25: qty 1

## 2016-01-25 MED ORDER — MAGIC MOUTHWASH
5.0000 mL | Freq: Once | ORAL | Status: AC
  Administered 2016-01-25: 5 mL via ORAL
  Filled 2016-01-25 (×2): qty 5

## 2016-01-25 MED ORDER — NYSTATIN 100000 UNIT/ML MT SUSP: 500000.0000 [IU] | Freq: Four times a day (QID) | OROMUCOSAL | 0 refills | Status: AC

## 2016-01-25 NOTE — ED Notes (Signed)
Pt upset d/t wait and HA/sore throat. Repeat lactic drawn, strep swab and urine collected.

## 2016-01-25 NOTE — ED Notes (Signed)
Resident at bedside for reassessment 

## 2016-01-25 NOTE — Discharge Instructions (Signed)
Given your extensive symptoms and history of HIV disease we have recommended you go to [facility] Emergency Department today for a more comprehensive evaluation.

## 2016-01-25 NOTE — ED Provider Notes (Signed)
CSN: [id number]     Arrival date & time 01/25/16  1346 History   None    Chief Complaint  Patient presents with  . Headache  . Sore Throat    Pt presents with multiple symptoms x 1 week. Reports h/o HIV disease Last CD4 count was 790 in 09/2015 . States he called the [facility] today and was told to come here. States x 1 week has had a progressively worsening h/a unlike any h/a he has ever had before. It began very mild but has worsened and persisted constantly x 1 week. Also reports severe sore throat, daily diarrhea (20+ episodes a day), denies abd pain or vomiting. Also reports occasional productive cough and fevers as high as > 101. Some sinus pressure as well. States he feels like he has something "stuck" in the middle of his chest at his sternum.       Past Medical History:  Diagnosis Date  . AIDS ([place]) 08/20/2015  . GERD (gastroesophageal reflux disease) 08/20/2015  . 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 Hypokalemia 08/20/2015  . Muscle spasm 08/20/2015  . Seasonal allergies 08/20/2015   History reviewed. No pertinent surgical history. History reviewed. No pertinent family history. Social History  Substance Use Topics  . Smoking status: Current Every Day Smoker    Packs/day: 0.50    Years: 32.00    Types: Cigarettes  . Smokeless tobacco: Never Used  . Alcohol use No    Review of Systems  Constitutional: Positive for chills, fatigue and fever.  HENT: Positive for congestion, sinus pressure and sore throat. Negative for ear pain, postnasal drip, rhinorrhea, sinus pain and trouble swallowing.   Eyes: Negative.   Respiratory: Positive for cough.   Cardiovascular: Positive for chest pain.  Gastrointestinal: Positive for diarrhea. Negative for abdominal pain, constipation, nausea and vomiting.  Genitourinary: Negative.   Skin: Negative.   Neurological: Negative.   Psychiatric/Behavioral: Negative.     Allergies  Shellfish allergy and Doxycycline  Home Medications    Prior to Admission medications   Medication Sig Start Date End Date Taking? Authorizing Provider  cetirizine (ZYRTEC) 10 MG tablet Take 10 mg by mouth daily.   Yes Historical Provider, MD  emtricitabine-tenofovir AF (DESCOVY) 200-25 MG tablet Take 1 tablet by mouth daily. 12/24/15  Yes Truman Hayward, MD  furosemide (LASIX) 10 MG/ML solution Take 2 mLs (20 mg total) by mouth daily. 09/03/15  Yes Truman Hayward, MD  gabapentin (NEURONTIN) 600 MG tablet Take 2 tablets (1,200 mg total) by mouth as directed. 12/10/15  Yes Truman Hayward, MD  lisinopril (PRINIVIL,ZESTRIL) 40 MG tablet TAKE 1 TABLET(40 MG) BY MOUTH DAILY 11/20/15  Yes Truman Hayward, MD  omeprazole (PRILOSEC) 20 MG capsule TAKE 1 CAPSULE(20 MG) BY MOUTH DAILY 10/24/15  Yes Truman Hayward, MD  potassium chloride (K-DUR,KLOR-CON) 10 MEQ tablet Take 1 tablet (10 mEq total) by mouth once. 09/03/15  Yes Truman Hayward, MD  clobetasol cream (TEMOVATE) [id number] % Apply 1 application topically daily.    Historical Provider, MD  cyclobenzaprine (FLEXERIL) 5 MG tablet Take 5 mg by mouth 3 (three) times daily.    Historical Provider, MD  dolutegravir (TIVICAY) 50 MG tablet Take 1 tablet (50 mg total) by mouth daily. 12/24/15   Truman Hayward, MD   Meds Ordered and Administered this Visit  Medications - No data to display  BP 150/74 (BP Location: Left Arm)   Pulse 103  Temp 99.7 F (37.6 C) (Oral)   Resp 16   SpO2 96%  No data found.   Physical Exam  Constitutional: He is oriented to person, place, and time. He appears well-developed and well-nourished. He appears ill.  HENT:  Head: Normocephalic and atraumatic.  Right Ear: Tympanic membrane normal.  Left Ear: Tympanic membrane normal.  Nose: Nose normal. Right sinus exhibits no maxillary sinus tenderness and no frontal sinus tenderness. Left sinus exhibits no maxillary sinus tenderness and no frontal sinus tenderness.  Mouth/Throat: Uvula is midline.  Mucous membranes are dry. No oropharyngeal exudate. Tonsillar exudate.  Diffuse white exudate to posterior pharynx c/w thrush. Minmal erythema, no swelling.   Eyes: Conjunctivae are normal.  Neck: Normal range of motion and full passive range of motion without pain. No neck rigidity.  Cardiovascular: Regular rhythm.  Tachycardia present.  Exam reveals no gallop and no friction rub.   No murmur heard. Pulmonary/Chest: Effort normal and breath sounds normal.  Abdominal: Soft. He exhibits no distension. There is no tenderness.  Neurological: He is alert and oriented to person, place, and time.  Skin: Skin is warm and dry.  Psychiatric: He has a normal mood and affect.  Nursing note and vitals reviewed.   Urgent Care Course   Clinical Course     Procedures (including critical care time)  Labs Review Labs Reviewed  POCT RAPID STREP A    Imaging Review No results found.   Visual Acuity Review  Right Eye Distance:   Left Eye Distance:   Bilateral Distance:    Right Eye Near:   Left Eye Near:    Bilateral Near:         MDM   1. Sore throat   2. Fever and chills   3. Diarrhea, unspecified type   4. History of HIV infection   Severity and constellation of symptoms require a more comprehensive evaluation in this HIV + individual. He appears ill though not toxic. Pt d/c'd with recommendations to go to [facility] ED for further evaluation, likely IV fluids w/ possible electrolyte replacement and or admission. Discussed with Dr Juventino Slovak who is in agreement w/ plan.     Jeryl [place], NP 01/25/16 1520

## 2016-01-25 NOTE — ED Triage Notes (Addendum)
Pt. Sent to [place] from our Urgent care, With fever 102.7 at present time, pt. Just took Tylenol 650 mg 1 hour ago, also has thrush in his throat and headache, n/v/d. Symptoms started about 1 week ago. Pt. Has had hiccups for 12 hours. Pt. Also reports having posterior neck pain and cough and congestion

## 2016-01-25 NOTE — ED Triage Notes (Signed)
The patient presented to the [facility] with a complaint of a headache and sore throat x 1 week. The patient reported a fever at home last night of 101.0 F. The patient reported taking tylenol PO at home 1 hour ago.

## 2016-01-25 NOTE — ED Provider Notes (Signed)
[facility] DEPT Provider Note   CSN: [id number] Arrival date & time: 01/25/16  1513     History   Chief Complaint Chief Complaint  Patient presents with  . Sore Throat  . Headache  . Fever    HPI Kevin Conrad is a 50 y.o. male.   Fever   This is a new problem. The current episode started more than 2 days ago. The problem occurs constantly. The problem has been gradually worsening. The maximum temperature noted was 102 to 102.9 F. The temperature was taken using an oral thermometer. Associated symptoms include headaches and sore throat. Pertinent negatives include no chest pain, no vomiting, no congestion and no cough. He has tried nothing for the symptoms. The treatment provided no relief.    Past Medical History:  Diagnosis Date  . AIDS ([place]) 08/20/2015  . GERD (gastroesophageal reflux disease) 08/20/2015  . 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 Hypokalemia 08/20/2015  . Muscle spasm 08/20/2015  . Seasonal allergies 08/20/2015    Patient Active Problem List   Diagnosis Date Noted  . Inguinal hernia, right 12/18/2015  . Recurrent left inguinal hernia 12/18/2015  . Umbilical hernia [id number]  . AIDS ([place]) 08/20/2015  . 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 GERD (gastroesophageal reflux disease) 08/20/2015  . Muscle spasm 08/20/2015  . Hypokalemia 08/20/2015  . Seasonal allergies 08/20/2015    History reviewed. No pertinent surgical history.     Home Medications    Prior to Admission medications   Medication Sig Start Date End Date Taking? Authorizing Provider  cetirizine (ZYRTEC) 10 MG tablet Take 10 mg by mouth daily.    Historical Provider, MD  clobetasol cream (TEMOVATE) [id number] % Apply 1 application topically daily.    Historical Provider, MD  cyclobenzaprine (FLEXERIL) 5 MG tablet Take 5 mg by mouth 3 (three) times daily.    Historical Provider, MD  dolutegravir (TIVICAY) 50 MG tablet Take 1 tablet (50 mg total) by mouth  daily. 12/24/15   Truman Hayward, MD  emtricitabine-tenofovir AF (DESCOVY) 200-25 MG tablet Take 1 tablet by mouth daily. 12/24/15   Truman Hayward, MD  furosemide (LASIX) 10 MG/ML solution Take 2 mLs (20 mg total) by mouth daily. 09/03/15   Truman Hayward, MD  gabapentin (NEURONTIN) 600 MG tablet Take 2 tablets (1,200 mg total) by mouth as directed. 12/10/15   Truman Hayward, MD  lisinopril (PRINIVIL,ZESTRIL) 40 MG tablet TAKE 1 TABLET(40 MG) BY MOUTH DAILY 11/20/15   Truman Hayward, MD  nystatin (MYCOSTATIN) 100000 UNIT/ML suspension Take 5 mLs (500,000 Units total) by mouth 4 (four) times daily. 01/25/16 02/01/16  Dewaine Conger, MD  omeprazole (PRILOSEC) 20 MG capsule TAKE 1 CAPSULE(20 MG) BY MOUTH DAILY 10/24/15   Truman Hayward, MD  ondansetron (ZOFRAN ODT) 4 MG disintegrating tablet Take 1 tablet (4 mg total) by mouth every 8 (eight) hours as needed for nausea or vomiting. 01/25/16   Dewaine Conger, MD  potassium chloride (K-DUR,KLOR-CON) 10 MEQ tablet Take 1 tablet (10 mEq total) by mouth once. 09/03/15   Truman Hayward, MD    Family History No family history on file.  Social History Social History  Substance Use Topics  . Smoking status: Current Every Day Smoker    Packs/day: 0.50    Years: 32.00    Types: Cigarettes  . Smokeless tobacco: Never Used  . Alcohol use No     Allergies   Shellfish  allergy and Doxycycline   Review of Systems Review of Systems  Constitutional: Positive for fever. Negative for chills.  HENT: Positive for sore throat. Negative for congestion and ear pain.   Eyes: Negative for pain and visual disturbance.  Respiratory: Negative for cough and shortness of breath.   Cardiovascular: Negative for chest pain and palpitations.  Gastrointestinal: Positive for nausea. Negative for abdominal pain and vomiting.  Genitourinary: Negative for dysuria and hematuria.  Musculoskeletal: Negative for arthralgias and back pain.  Skin: Negative for  color change and rash.  Neurological: Positive for headaches. Negative for seizures and syncope.  All other systems reviewed and are negative.    Physical Exam Updated Vital Signs BP 145/94   Pulse 82   Temp 98.7 F (37.1 C) (Oral)   Resp 17   Ht 5\' 7"  (1.702 m)   Wt 93 kg   SpO2 95%   BMI 32.11 kg/m   Physical Exam  Constitutional: He is oriented to person, place, and time. He appears well-developed and well-nourished.  HENT:  Head: Normocephalic and atraumatic.  Mouth/Throat: Uvula is midline and mucous membranes are normal. Oropharyngeal exudate and posterior oropharyngeal erythema present. No posterior oropharyngeal edema or tonsillar abscesses.  Eyes: Conjunctivae are normal.  Neck: Normal range of motion and full passive range of motion without pain. Neck supple. No spinous process tenderness and no muscular tenderness present. No neck rigidity. Normal range of motion present.  Cardiovascular: Normal rate and regular rhythm.   No murmur heard. Pulmonary/Chest: Effort normal and breath sounds normal. No respiratory distress.  Abdominal: Soft. There is no tenderness.  Musculoskeletal: He exhibits no edema.  Lymphadenopathy:    He has cervical adenopathy.  Neurological: He is alert and oriented to person, place, and time. No cranial nerve deficit or sensory deficit. He exhibits normal muscle tone. Coordination normal.  Skin: Skin is warm and dry.  Psychiatric: He has a normal mood and affect.  Nursing note and vitals reviewed.    ED Treatments / Results  Labs (all labs ordered are listed, but only abnormal results are displayed) Labs Reviewed  COMPREHENSIVE METABOLIC PANEL - Abnormal; Notable for the following:       Result Value   CO2 20 (*)    Glucose, Bld 116 (*)    Calcium 8.4 (*)    All other components within normal limits  CBC WITH DIFFERENTIAL/PLATELET - Abnormal; Notable for the following:    WBC 13.1 (*)    Neutro Abs 10.1 (*)    Monocytes Absolute  1.1 (*)    All other components within normal limits  RAPID STREP SCREEN (NOT AT [facility])  CULTURE, BLOOD (ROUTINE X 2)  CULTURE, BLOOD (ROUTINE X 2)  URINE CULTURE  CULTURE, GROUP A STREP ([place])  URINALYSIS, ROUTINE W REFLEX MICROSCOPIC  I-STAT CG4 LACTIC ACID, ED  I-STAT CG4 LACTIC ACID, ED    EKG  EKG Interpretation  Date/Time:  Friday January 25 2016 17:24:16 EST Ventricular Rate:  91 PR Interval:    QRS Duration: 97 QT Interval:  400 QTC Calculation: 493 R Axis:   113 Text Interpretation:  Sinus rhythm Prolonged PR interval Right axis deviation Borderline T abnormalities, anterior leads Borderline prolonged QT interval no prior EKG  Confirmed by LIU MD, DANA [phone]) on 01/25/2016 6:09:29 PM       Radiology Dg Chest 2 View  Result Date: 01/25/2016 CLINICAL DATA:  Oral thrush.  Chest pain.  HIV. EXAM: CHEST  2 VIEW COMPARISON:  Overlapping portions of  CT abdomen 12/16/2015 FINDINGS: Mildly low lung volumes. Even compensating for this, there appears to be new mild cardiomegaly. Linear subsegmental atelectasis or scarring at the left lung base. Lateral projection blurred by motion artifact especially around the lung bases. The lungs appear otherwise clear. IMPRESSION: 1. Mild enlargement of the cardiopericardial silhouette which appears to be new. Some of this accentuation of cardiac size is probably from low lung volumes. 2. Faint opacity at the left lung base favoring subsegmental atelectasis. Electronically Signed   By: Van Clines M.D.   On: 01/25/2016 16:57    Procedures Procedures (including critical care time)  Medications Ordered in ED Medications  sodium chloride 0.9 % bolus 1,000 mL (0 mLs Intravenous Stopped 01/25/16 1951)  ibuprofen (ADVIL,MOTRIN) tablet 600 mg (600 mg Oral Given 01/25/16 1716)  alum & mag hydroxide-simeth (MAALOX/MYLANTA) 200-200-20 MG/5ML suspension 15 mL (15 mLs Oral Given 01/25/16 1942)  ondansetron (ZOFRAN) injection 4 mg (4 mg Intravenous  Given 01/25/16 1716)  magic mouthwash (5 mLs Oral Given 01/25/16 2029)     Initial Impression / Assessment and Plan / ED Course  I have reviewed the triage vital signs and the nursing notes.  Pertinent labs & imaging results that were available during my care of the patient were reviewed by me and considered in my medical decision making (see chart for details).  Clinical Course     50 year old male with sore throat headache fevers comes today for evaluation. He's had some burning in his chest. Some intermittent nausea headaches fevers and sore throat. On exam he has full range of motion of his neck with no meningeal signs. His febrile 102 no tachycardia or hypotension. He has no neurologic deficits on exam. Oropharyngeal exam shows erythema with exudative tonsils, no uvular deviation or signs of peritonsillar abscess. No pain with range of motion of neck. Painful cervical lymphadenopathy. Low concern for PTA or RPA with patient's presentation. Patient states he has had meningitis in the past was nothing like it. Of note he is HIV positive however has undetectable viral load. CD4 count as of August of this year has always had good compliance with his medications. He refuses lumbar puncture even though this may be part of his workup today knowing consequences and having capacity to make decisions. Do not feel that he has meningitis at this time however it was offered spars worked up and he refused on multiple occasions. Likely this patient's viral illness causing fever as well as thrush possibly esophagitis. He is treated with nystatin mouthwash. Patient agrees this plan and follow-up with his primary care and infectious disease doctors after this visit today. Vital signs are stable he's feeling much better after treatment and follow-up. Strict return precautions are given. Of note he did have a strep screen done in his primary care office and it was negative.  Final Clinical Impressions(s) / ED  Diagnoses   Final diagnoses:  Thrush  Fever, unspecified fever cause    New Prescriptions New Prescriptions   NYSTATIN (MYCOSTATIN) 100000 UNIT/ML SUSPENSION    Take 5 mLs (500,000 Units total) by mouth 4 (four) times daily.   ONDANSETRON (ZOFRAN ODT) 4 MG DISINTEGRATING TABLET    Take 1 tablet (4 mg total) by mouth every 8 (eight) hours as needed for nausea or vomiting.     Dewaine Conger, MD 01/25/16 2108    Forde Dandy, MD 01/26/16 [phone]

## 2016-01-26 LAB — URINE CULTURE

## 2016-01-28 LAB — CULTURE, GROUP A STREP (THRC)

## 2016-01-30 LAB — CULTURE, BLOOD (ROUTINE X 2)
CULTURE: NO GROWTH
CULTURE: NO GROWTH

## 2016-02-06 ENCOUNTER — Ambulatory Visit: Payer: Self-pay

## 2016-02-18 DIAGNOSIS — C7951 Secondary malignant neoplasm of bone: Secondary | ICD-10-CM

## 2016-02-18 DIAGNOSIS — C20 Malignant neoplasm of rectum: Secondary | ICD-10-CM

## 2016-02-18 HISTORY — DX: Malignant neoplasm of rectum: C20

## 2016-02-18 HISTORY — DX: Secondary malignant neoplasm of bone: C79.51

## 2016-04-08 ENCOUNTER — Other Ambulatory Visit: Payer: Self-pay

## 2016-04-08 DIAGNOSIS — B20 Human immunodeficiency virus [HIV] disease: Secondary | ICD-10-CM

## 2016-04-08 LAB — COMPLETE METABOLIC PANEL WITH GFR
ALBUMIN: 4 g/dL (ref 3.6–5.1)
ALT: 16 U/L (ref 9–46)
AST: 27 U/L (ref 10–40)
Alkaline Phosphatase: 48 U/L (ref 40–115)
BUN: 15 mg/dL (ref 7–25)
CHLORIDE: 104 mmol/L (ref 98–110)
CO2: 21 mmol/L (ref 20–31)
Calcium: 9.2 mg/dL (ref 8.6–10.3)
Creat: 1.18 mg/dL (ref 0.60–1.35)
GFR, EST NON AFRICAN AMERICAN: 72 mL/min (ref >=60)
GFR, Est African American: 83 mL/min (ref >=60)
Glucose, Bld: 113 mg/dL — ABNORMAL HIGH (ref 65–99)
POTASSIUM: 3.9 mmol/L (ref 3.5–5.3)
Sodium: 137 mmol/L (ref 135–146)
Total Bilirubin: 0.5 mg/dL (ref 0.2–1.2)
Total Protein: 6.8 g/dL (ref 6.1–8.1)

## 2016-04-08 LAB — CBC WITH DIFFERENTIAL/PLATELET
BASOS ABS: 83 {cells}/uL (ref 0–200)
Basophils Relative: 1 %
Eosinophils Absolute: 166 cells/uL (ref 15–500)
Eosinophils Relative: 2 %
HEMATOCRIT: 45.1 % (ref 38.5–50.0)
Hemoglobin: 15.2 g/dL (ref 13.2–17.1)
LYMPHS PCT: 37 %
Lymphs Abs: 3071 cells/uL (ref 850–3900)
MCH: 30.3 pg (ref 27.0–33.0)
MCHC: 33.7 g/dL (ref 32.0–36.0)
MCV: 90 fL (ref 80.0–100.0)
MONO ABS: 664 {cells}/uL (ref 200–950)
MPV: 11.8 fL (ref 7.5–12.5)
Monocytes Relative: 8 %
NEUTROS PCT: 52 %
Neutro Abs: 4316 cells/uL (ref 1500–7800)
Platelets: 219 10*3/uL (ref 140–400)
RBC: 5.01 MIL/uL (ref 4.20–5.80)
RDW: 14.5 % (ref 11.0–15.0)
WBC: 8.3 10*3/uL (ref 3.8–10.8)

## 2016-04-09 ENCOUNTER — Other Ambulatory Visit: Payer: Self-pay

## 2016-04-09 ENCOUNTER — Other Ambulatory Visit: Payer: Self-pay | Admitting: Infectious Disease

## 2016-04-09 DIAGNOSIS — B20 Human immunodeficiency virus [HIV] disease: Secondary | ICD-10-CM

## 2016-04-09 LAB — RPR

## 2016-04-09 LAB — T-HELPER CELL (CD4) - (RCID CLINIC ONLY)
CD4 % Helper T Cell: 33 % (ref 33–55)
CD4 T CELL ABS: 1080 /uL (ref 400–2700)

## 2016-04-11 LAB — HIV-1 RNA QUANT-NO REFLEX-BLD
HIV 1 RNA Quant: <20 copies/mL
HIV-1 RNA QUANT, LOG: NOT DETECTED {Log_copies}/mL

## 2016-04-15 ENCOUNTER — Encounter: Payer: Self-pay | Admitting: Infectious Disease

## 2016-04-17 ENCOUNTER — Encounter: Payer: Self-pay | Admitting: Infectious Disease

## 2016-04-17 ENCOUNTER — Ambulatory Visit (INDEPENDENT_AMBULATORY_CARE_PROVIDER_SITE_OTHER): Payer: Self-pay | Admitting: Infectious Disease

## 2016-04-17 VITALS — BP 152/94 | HR 89 | Temp 98.8°F | Ht 67.0 in | Wt 223.0 lb

## 2016-04-17 DIAGNOSIS — R110 Nausea: Secondary | ICD-10-CM

## 2016-04-17 DIAGNOSIS — B20 Human immunodeficiency virus [HIV] disease: Secondary | ICD-10-CM

## 2016-04-17 DIAGNOSIS — K648 Other hemorrhoids: Secondary | ICD-10-CM

## 2016-04-17 DIAGNOSIS — K429 Umbilical hernia without obstruction or gangrene: Secondary | ICD-10-CM

## 2016-04-17 DIAGNOSIS — K4091 Unilateral inguinal hernia, without obstruction or gangrene, recurrent: Secondary | ICD-10-CM

## 2016-04-17 DIAGNOSIS — K219 Gastro-esophageal reflux disease without esophagitis: Secondary | ICD-10-CM

## 2016-04-17 HISTORY — DX: Other hemorrhoids: K64.8

## 2016-04-17 HISTORY — DX: Nausea: R11.0

## 2016-04-17 MED ORDER — MENINGOCOCCAL A C Y&W-135 OLIG IM SOLR
0.5000 mL | Freq: Once | INTRAMUSCULAR | Status: AC
  Administered 2016-04-17: 0.5 mL via INTRAMUSCULAR

## 2016-04-17 MED ORDER — ONDANSETRON HCL 4 MG PO TABS: 4.0000 mg | ORAL_TABLET | Freq: Two times a day (BID) | ORAL | 4 refills | Status: AC | PRN

## 2016-04-17 NOTE — Progress Notes (Signed)
Chief complaint: followup for his HIV disease   Subjective:    Patient ID: Kevin Conrad, male    DOB: 08-26-1965, 51 y.o.   MRN: [id number]  HPI  51 year old man with HIV disease, formerly AIDS who had been incarcerated for 18 years recently released from prison. He had been well controlled on Prezista, Norvir, Viread and 3TC x 10 years. He states that he was initially on AZT monotherapy, later boosted reyataz but he does not recall of his regimens. His VL was <20 5 days after running out of his meds. We placed him on Prezcobix and Truvada and then to Prezcobix and Descovy  He was changed over to [facility] and [place] when he had severe diarrhea and nausea. He was found to have a hernia which is going to be surgically repaired [facility]. He also is suffering from what sounds like internal hemorrhoids with some bleeding with defecation.       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  Past Medical History:  Diagnosis Date  . AIDS ([place]) 08/20/2015  . GERD (gastroesophageal reflux disease) 08/20/2015  . 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 Hypokalemia 08/20/2015  . Muscle spasm 08/20/2015  . Seasonal allergies 08/20/2015    No past surgical history on file.  No family history on file.    Social History   Social History  . Marital status: Single    Spouse name: N/A  . Number of children: N/A  . Years of education: N/A   Social History Main Topics  . Smoking status: Current Every Day Smoker    Packs/day: 0.25    Years: 32.00    Types: Cigarettes  . Smokeless tobacco: Never Used  . Alcohol use No  . Drug use: No  . Sexual activity: No     Comment: pt  declined   Other Topics Concern  . None   Social History Narrative  . None    Allergies  Allergen Reactions  . Shellfish Allergy Anaphylaxis  . Doxycycline Swelling    Hives, rash, itching and feels his throat swelling     Current Outpatient Prescriptions:  .  cetirizine (ZYRTEC) 10 MG tablet, Take 10 mg by mouth daily., Disp: , Rfl:  .  clobetasol cream (TEMOVATE) [id number] %, Apply 1 application topically daily., Disp: , Rfl:  .  dolutegravir (TIVICAY) 50 MG tablet, Take 1 tablet (50 mg total) by mouth daily., Disp: 30 tablet, Rfl: 6 .  emtricitabine-tenofovir AF (DESCOVY) 200-25 MG tablet, Take 1 tablet by mouth daily., Disp: 30 tablet, Rfl: 6 .  furosemide (LASIX) 10 MG/ML solution, Take 2 mLs (20 mg total) by mouth daily., Disp: 60 mL, Rfl: 1 .  gabapentin (NEURONTIN) 600 MG tablet, TAKE 2 TABLETS BY MOUTH DAILY IN THE MORNING, 2 TABLETS 8 HOURS AFTER THIS, AND ANOTHER 2 TABLETS AT BEDTIME, Disp: 180 tablet, Rfl: 3 .  lisinopril (PRINIVIL,ZESTRIL) 40 MG tablet, TAKE 1 TABLET(40 MG) BY MOUTH DAILY, Disp: 30 tablet, Rfl: 5 .  omeprazole (PRILOSEC) 20 MG capsule, TAKE 1 CAPSULE(20 MG) BY MOUTH DAILY, Disp: 30 capsule, Rfl: 5 .  potassium chloride (K-DUR,KLOR-CON) 10 MEQ tablet, Take 1 tablet (  10 mEq total) by mouth once., Disp: 30 tablet, Rfl: 1 .  ondansetron (ZOFRAN ODT) 4 MG disintegrating tablet, Take 1 tablet (4 mg total) by mouth every 8 (eight) hours as needed for nausea or vomiting. (Patient not taking: Reported on 04/17/2016), Disp: 10 tablet, Rfl: 0    Review of Systems  Constitutional: Negative for activity change, appetite change, chills, diaphoresis, fatigue, fever and unexpected weight change.  HENT: Negative for congestion, rhinorrhea, sinus pressure, sneezing, sore throat and trouble swallowing.   Eyes: Negative for photophobia and visual disturbance.  Respiratory: Negative for cough, chest tightness, shortness of breath, wheezing and stridor.   Cardiovascular: Negative  for chest pain, palpitations and leg swelling.  Gastrointestinal: Positive for abdominal pain and anal bleeding. Negative for abdominal distention, constipation, diarrhea, nausea and vomiting.  Genitourinary: Negative for difficulty urinating, dysuria, flank pain and hematuria.  Musculoskeletal: Negative for arthralgias, back pain, gait problem, joint swelling and myalgias.  Skin: Negative for color change, pallor, rash and wound.  Neurological: Positive for numbness. Negative for dizziness, tremors, weakness and light-headedness.  Hematological: Negative for adenopathy. Does not bruise/bleed easily.  Psychiatric/Behavioral: Negative for agitation, behavioral problems, confusion, decreased concentration, dysphoric mood and sleep disturbance.       Objective:   Physical Exam  Constitutional: He is oriented to person, place, and time. He appears well-developed and well-nourished.  HENT:  Head: Normocephalic and atraumatic.  Eyes: Conjunctivae and EOM are normal.  Neck: Normal range of motion. Neck supple.  Cardiovascular: Normal rate and regular rhythm.   Pulmonary/Chest: Effort normal. No respiratory distress. He has no wheezes.  Abdominal: Soft. He exhibits no distension.  Musculoskeletal: Normal range of motion. He exhibits no edema or tenderness.  Neurological: He is alert and oriented to person, place, and time.  Skin: Skin is warm and dry. No rash noted. No erythema. No pallor.  Psychiatric: His behavior is normal. Judgment and thought content normal. He exhibits a depressed mood.    10/03/15: Hernia          Assessment & Plan:   HIV disease:  TIVICAY and DESCOVY, ADAP renewed, RTC when time to renew  HIV neuropathy: escalate dose of GAPAPENTIN by 600 mg and aim for 1200 mg TID  Hernia:He is going to go to [facility] For surgical repair  Blood per rectum with defecation: This is a surely due to hemorrhoids: Would ask that he have this looked at by the General surgeons  who is seeing him at [place]   GERD: continue PPI  I spent greater than 25 minutes with the patient including greater than 50% of time in face to face counsel of the patient re his HIV, his neuropathy hernia, blood per rectum, and in coordination of his care.

## 2016-05-05 ENCOUNTER — Ambulatory Visit: Payer: Self-pay

## 2016-05-06 ENCOUNTER — Telehealth: Payer: Self-pay | Admitting: Infectious Diseases

## 2016-05-06 NOTE — Telephone Encounter (Signed)
Called pt about his recent visit and pt complaint.  Pt seen on 3-1 at [facility] for routine visit where he also complained of rectal bleeding. He asked for his rectum to be eval.  He was seen on 3-8 for a hernia repair which he underwent without difficulty.  His rectal bleeding was not eval.  He was then on 3-15 at [facility] ED for fever, persistent rectal bleeding and abd pain. He states that he was then found to have multiple masses as well as a rectal mass.  His CT scan showed: 1. Compared to the outside CT from October 2017, there are new enlarged lymph nodes in multiple nodal stations, including the lower mediastinum, peritoneum of the pelvis, and retroperitoneum of the abdomen and pelvis as described above. Largest nodal mass is adjacent to the rectum, with adjacent mass effect on the distal sigmoid colon and rectum with bowel wall thickening and surrounding stranding. These findings raise concern for lymphoproliferative disorder, especially given patient's known HIV infection. 2. Postoperative changes from bilateral inguinal hernia repair, with stranding in the bilateral inguinal regions. There is a fluid containing collection in the right inguinal canal with internal high density material, favored to reflect a postoperative collection/hematoma. Please note evaluation for infected fluid collection is markedly limited in the absence of IV contrast. 3. Mild bladder wall thickening with surrounding stranding, which could relate to adjacent postoperative changes, however would recommend correlation with urinalysis to exclude infection.   He had a bx and was d/c home the next day. Result is still pending.   I offered him a follow up appt with me or any MD in office.

## 2016-05-20 ENCOUNTER — Telehealth: Payer: Self-pay | Admitting: *Deleted

## 2016-05-20 NOTE — Telephone Encounter (Signed)
Patient called to inform Dr. Johnnye Sima that he is currently inpatient at [facility]. He is being treated for rectal cancer.

## 2016-06-03 ENCOUNTER — Other Ambulatory Visit: Payer: Self-pay | Admitting: Infectious Disease

## 2016-06-03 DIAGNOSIS — I10 Essential (primary) hypertension: Secondary | ICD-10-CM

## 2016-06-04 ENCOUNTER — Telehealth: Payer: Self-pay | Admitting: *Deleted

## 2016-06-04 NOTE — Telephone Encounter (Signed)
Patient called stating he has been discharged from [facility] and while there he was on acyclovir for "fever blisters" in his mouth. He is requesting an Rx for Acyclovir. He uses [facility] on [address]; please advise. This medication is not on his med list.

## 2016-06-05 NOTE — Telephone Encounter (Signed)
Please fill if he can give you details of rx thanks

## 2016-06-09 ENCOUNTER — Other Ambulatory Visit: Payer: Self-pay | Admitting: *Deleted

## 2016-06-09 MED ORDER — ACYCLOVIR 400 MG PO TABS: 400.0000 mg | ORAL_TABLET | Freq: Three times a day (TID) | ORAL | 5 refills | Status: AC

## 2016-06-10 NOTE — Telephone Encounter (Signed)
Notified patient and he said he would call the hospital for the dose and directions on the medication he is requesting.

## 2016-06-20 ENCOUNTER — Other Ambulatory Visit: Payer: Self-pay | Admitting: Infectious Disease

## 2016-07-18 DIAGNOSIS — I214 Non-ST elevation (NSTEMI) myocardial infarction: Secondary | ICD-10-CM

## 2016-07-18 HISTORY — PX: CORONARY ANGIOPLASTY WITH STENT PLACEMENT: SHX49

## 2016-07-18 HISTORY — DX: Non-ST elevation (NSTEMI) myocardial infarction: I21.4

## 2016-07-27 ENCOUNTER — Other Ambulatory Visit: Payer: Self-pay | Admitting: Infectious Disease

## 2016-07-27 DIAGNOSIS — B20 Human immunodeficiency virus [HIV] disease: Secondary | ICD-10-CM

## 2016-08-08 ENCOUNTER — Telehealth (HOSPITAL_COMMUNITY): Payer: Self-pay

## 2016-08-08 NOTE — Telephone Encounter (Signed)
Our office had received a referral for patient to participate in [facility] cardiac rehab.I called and spoke with Junie Panning at [facility] cardiology @ [phone]. I called referring office to inform them that I had spoken to the patient and that patient was not interested at this time. Patient stated that he has terminal cancer and is focusing on his treatment at this time.

## 2016-08-24 ENCOUNTER — Other Ambulatory Visit: Payer: Self-pay | Admitting: Infectious Disease

## 2016-08-24 DIAGNOSIS — B20 Human immunodeficiency virus [HIV] disease: Secondary | ICD-10-CM

## 2016-08-28 ENCOUNTER — Other Ambulatory Visit: Payer: Medicaid Other

## 2016-08-28 ENCOUNTER — Other Ambulatory Visit (HOSPITAL_COMMUNITY)
Admission: RE | Admit: 2016-08-28 | Discharge: 2016-08-28 | Disposition: A | Discharge status: Home / Self Care | Payer: Medicaid Other | Source: Ambulatory Visit | Attending: Infectious Diseases | Admitting: Infectious Diseases

## 2016-08-28 DIAGNOSIS — B20 Human immunodeficiency virus [HIV] disease: Secondary | ICD-10-CM

## 2016-08-28 LAB — LIPID PANEL
Cholesterol: 149 mg/dL (ref <200)
HDL: 42 mg/dL (ref >40)
LDL CALC: 74 mg/dL (ref <100)
TRIGLYCERIDES: 167 mg/dL — AB (ref <150)
Total CHOL/HDL Ratio: 3.5 Ratio (ref <5.0)
VLDL: 33 mg/dL — AB (ref <30)

## 2016-08-28 LAB — CBC WITH DIFFERENTIAL/PLATELET
BASOS ABS: 68 {cells}/uL (ref 0–200)
BASOS PCT: 1 %
EOS PCT: 0 %
Eosinophils Absolute: 0 cells/uL — ABNORMAL LOW (ref 15–500)
HCT: 37.5 % — ABNORMAL LOW (ref 38.5–50.0)
Hemoglobin: 12.3 g/dL — ABNORMAL LOW (ref 13.2–17.1)
LYMPHS ABS: 2244 {cells}/uL (ref 850–3900)
Lymphocytes Relative: 33 %
MCH: 31 pg (ref 27.0–33.0)
MCHC: 32.8 g/dL (ref 32.0–36.0)
MCV: 94.5 fL (ref 80.0–100.0)
MONOS PCT: 8 %
MPV: 10.2 fL (ref 7.5–12.5)
Monocytes Absolute: 544 cells/uL (ref 200–950)
NEUTROS ABS: 3944 {cells}/uL (ref 1500–7800)
Neutrophils Relative %: 58 %
PLATELETS: 291 10*3/uL (ref 140–400)
RBC: 3.97 MIL/uL — AB (ref 4.20–5.80)
RDW: 21.6 % — ABNORMAL HIGH (ref 11.0–15.0)
WBC: 6.8 10*3/uL (ref 3.8–10.8)

## 2016-08-28 LAB — COMPLETE METABOLIC PANEL WITH GFR
ALT: 16 U/L (ref 9–46)
AST: 15 U/L (ref 10–40)
Albumin: 4.3 g/dL (ref 3.6–5.1)
Alkaline Phosphatase: 77 U/L (ref 40–115)
BILIRUBIN TOTAL: 0.3 mg/dL (ref 0.2–1.2)
BUN: 15 mg/dL (ref 7–25)
CO2: 25 mmol/L (ref 20–31)
Calcium: 9.4 mg/dL (ref 8.6–10.3)
Chloride: 104 mmol/L (ref 98–110)
Creat: 0.89 mg/dL (ref 0.60–1.35)
GFR, Est African American: >89 mL/min (ref >=60)
GFR, Est Non African American: >89 mL/min (ref >=60)
GLUCOSE: 127 mg/dL — AB (ref 65–99)
Potassium: 4.7 mmol/L (ref 3.5–5.3)
SODIUM: 138 mmol/L (ref 135–146)
TOTAL PROTEIN: 6.9 g/dL (ref 6.1–8.1)

## 2016-08-29 LAB — URINE CYTOLOGY ANCILLARY ONLY
CHLAMYDIA, DNA PROBE: NEGATIVE
Neisseria Gonorrhea: NEGATIVE

## 2016-08-29 LAB — T-HELPER CELL (CD4) - (RCID CLINIC ONLY)
CD4 % Helper T Cell: 30 % — ABNORMAL LOW (ref 33–55)
CD4 T Cell Abs: 620 /uL (ref 400–2700)

## 2016-08-29 LAB — RPR

## 2016-09-02 LAB — HIV-1 RNA QUANT-NO REFLEX-BLD
HIV 1 RNA QUANT: NOT DETECTED {copies}/mL
HIV-1 RNA QUANT, LOG: NOT DETECTED {Log_copies}/mL

## 2016-09-19 ENCOUNTER — Other Ambulatory Visit: Payer: Self-pay | Admitting: Infectious Diseases

## 2016-09-19 DIAGNOSIS — I10 Essential (primary) hypertension: Secondary | ICD-10-CM

## 2016-09-24 ENCOUNTER — Other Ambulatory Visit: Payer: Self-pay | Admitting: Infectious Diseases

## 2016-09-24 ENCOUNTER — Encounter: Payer: Self-pay | Admitting: Infectious Diseases

## 2016-09-24 ENCOUNTER — Ambulatory Visit (INDEPENDENT_AMBULATORY_CARE_PROVIDER_SITE_OTHER): Payer: Medicaid Other | Admitting: Infectious Diseases

## 2016-09-24 VITALS — BP 160/101 | HR 77 | Temp 98.5°F | Wt 220.0 lb

## 2016-09-24 DIAGNOSIS — Z789 Other specified health status: Secondary | ICD-10-CM | POA: Diagnosis not present

## 2016-09-24 DIAGNOSIS — I214 Non-ST elevation (NSTEMI) myocardial infarction: Secondary | ICD-10-CM

## 2016-09-24 DIAGNOSIS — Z720 Tobacco use: Secondary | ICD-10-CM | POA: Diagnosis not present

## 2016-09-24 DIAGNOSIS — G909 Disorder of the autonomic nervous system, unspecified: Principal | ICD-10-CM

## 2016-09-24 DIAGNOSIS — B20 Human immunodeficiency virus [HIV] disease: Secondary | ICD-10-CM

## 2016-09-24 DIAGNOSIS — C20 Malignant neoplasm of rectum: Secondary | ICD-10-CM

## 2016-09-24 DIAGNOSIS — C7951 Secondary malignant neoplasm of bone: Secondary | ICD-10-CM

## 2016-09-24 DIAGNOSIS — Z79891 Long term (current) use of opiate analgesic: Secondary | ICD-10-CM | POA: Diagnosis not present

## 2016-09-24 LAB — GLUCOSE, CAPILLARY: GLUCOSE-CAPILLARY: 116 mg/dL — AB (ref 65–99)

## 2016-09-24 MED ORDER — GABAPENTIN 600 MG PO TABS: 1200.0000 mg | ORAL_TABLET | Freq: Three times a day (TID) | ORAL | 2 refills | Status: AC

## 2016-09-24 NOTE — Assessment & Plan Note (Signed)
His neuropathy is refilled.

## 2016-09-24 NOTE — Assessment & Plan Note (Signed)
He is followed by palliative care and they are managing this.

## 2016-09-24 NOTE — Assessment & Plan Note (Addendum)
He continues to get XRT and has f/u with onc and rad-onc. He has questions as to whether or not this is curable (his oncologist states this is not).  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

## 2016-09-24 NOTE — Assessment & Plan Note (Signed)
Encouraged him to quit, he is going to get chantix from his PCP

## 2016-09-24 NOTE — Assessment & Plan Note (Signed)
Despite his multiple co-morbidities, he is doing very well. His CD4 has dropped but is still in the normal range.  He is up to date with his vaccines.  Offered/refused condoms.  Will see him back in 3-4 months

## 2016-09-24 NOTE — Assessment & Plan Note (Signed)
He is asx Will f/u with his CV.

## 2016-09-24 NOTE — Progress Notes (Signed)
   Subjective:    Patient ID: Kevin Conrad, male    DOB: 09/05/1965, 51 y.o.   MRN: [id number]  HPI 51 yo M with hx of HIV+ dx 1991. His current art is descovy and tivicay. Denies any hx of drug resistance.  Also hx of rectal CA dx 04-2016 which as gotten into "colon, chest and glands". Has completed chemo (etoposide, cisplatinum), now getting XRT. Small cell per pt. Neuroendocrine in pelvic bones, mediastinum, peritoneum, retroperitoneum per notes.   Also hx of NSTEMI 07-2016 with PTCA with 2 stents placed (RCA and L circ).   States at his last hosp he was given rx for his elevated Glc. Would like this rechecked today.  Is going to get chantix rx.   HIV 1 RNA Quant (copies/mL)  Date Value  08/28/2016 <20 NOT DETECTED  04/08/2016 <20 NOT DETECTED  09/20/2015 <20   CD4 T Cell Abs (/uL)  Date Value  08/28/2016 620  04/08/2016 1,080  09/20/2015 790   The past medical history, family history and social history were reviewed/updated in EPIC  His HCPOA is his sister.  Sees natalie at [facility]  Review of Systems  Constitutional: Negative for appetite change, chills, fever and unexpected weight change.  Respiratory: Negative for cough and shortness of breath.   Cardiovascular: Negative for chest pain.  Gastrointestinal: Negative for constipation and diarrhea.  Genitourinary: Positive for dysuria. Negative for difficulty urinating.  Neurological: Positive for numbness.  Hematological: Negative for adenopathy.  burning in feet, painful neuropathy.  painful defecation  Since XRT. Has rad-onc f/u tomorrow.  Has R chest port, no problems with this.     Objective:   Physical Exam  Constitutional: He appears well-developed and well-nourished.  HENT:  Mouth/Throat: No oropharyngeal exudate.  Eyes: Pupils are equal, round, and reactive to light. EOM are normal.  Neck: Neck supple.  Cardiovascular: Normal rate, regular rhythm and normal heart sounds.   Pulmonary/Chest: Effort normal and  breath sounds normal.  R chest port is non-tender, no fluctuance.   Abdominal: Soft. Bowel sounds are normal. There is no tenderness. There is no rebound.  Musculoskeletal: He exhibits no edema.  Lymphadenopathy:    He has no cervical adenopathy.  Skin: Skin is warm and dry. No rash noted. No erythema.  Psychiatric: He has a normal mood and affect.       Assessment & Plan:

## 2016-10-10 ENCOUNTER — Telehealth: Payer: Self-pay | Admitting: Pharmacist

## 2016-10-10 NOTE — Telephone Encounter (Signed)
Patient called wanting Dr. Johnnye Sima to prescribe him a steroid dose pack for his inflammation with his rectal cancer.  I told him I could not do that and Dr. Johnnye Sima would probably advise him to contact his oncologist/radiologist about prescribing him that.  He also said he's been trying to get in touch with them.  Encouraged him to keep trying as this is something they should be managing.

## 2016-10-14 ENCOUNTER — Other Ambulatory Visit: Payer: Self-pay

## 2016-10-14 ENCOUNTER — Other Ambulatory Visit: Payer: Self-pay | Admitting: *Deleted

## 2016-10-14 ENCOUNTER — Telehealth: Payer: Self-pay | Admitting: *Deleted

## 2016-10-14 MED ORDER — ENSURE PO LIQD: 237.0000 mL | Freq: Three times a day (TID) | ORAL | 11 refills | Status: DC

## 2016-10-14 NOTE — Telephone Encounter (Signed)
Patient is requesting Ensure. He has been getting Boost (when available) from [facility] due to nausea/vomiting with chemo/radiation. Please advise if Ensure is appropriate, and if so, how many cases/month.  Landis Gandy, RN

## 2016-10-15 ENCOUNTER — Other Ambulatory Visit: Payer: Self-pay | Admitting: *Deleted

## 2016-10-15 MED ORDER — ENSURE PO LIQD: 237.0000 mL | Freq: Three times a day (TID) | ORAL | 11 refills | Status: AC

## 2016-10-15 NOTE — Telephone Encounter (Signed)
Ensure appropriate 1 case/month thanks

## 2016-10-27 ENCOUNTER — Ambulatory Visit: Payer: Self-pay | Admitting: Infectious Disease

## 2016-11-10 ENCOUNTER — Other Ambulatory Visit: Payer: Self-pay | Admitting: Infectious Disease

## 2016-11-10 DIAGNOSIS — B20 Human immunodeficiency virus [HIV] disease: Secondary | ICD-10-CM

## 2016-11-12 ENCOUNTER — Other Ambulatory Visit: Payer: Self-pay | Admitting: Infectious Disease

## 2016-11-12 DIAGNOSIS — B20 Human immunodeficiency virus [HIV] disease: Secondary | ICD-10-CM

## 2016-11-14 ENCOUNTER — Telehealth: Payer: Self-pay | Admitting: *Deleted

## 2016-11-14 NOTE — Telephone Encounter (Signed)
Informed patient that Medicaid denied him the refill for Gabapentin stating he has to try and fail 2 other similar medications.

## 2016-12-09 ENCOUNTER — Other Ambulatory Visit: Payer: Self-pay | Admitting: Infectious Disease

## 2016-12-11 ENCOUNTER — Other Ambulatory Visit: Payer: Medicaid Other

## 2016-12-11 DIAGNOSIS — B20 Human immunodeficiency virus [HIV] disease: Secondary | ICD-10-CM

## 2016-12-11 LAB — COMPLETE METABOLIC PANEL WITH GFR
AG Ratio: 1.2 (calc) (ref 1.0–2.5)
ALT: 12 U/L (ref 9–46)
AST: 16 U/L (ref 10–40)
Albumin: 3.6 g/dL (ref 3.6–5.1)
Alkaline phosphatase (APISO): 97 U/L (ref 40–115)
BUN: 12 mg/dL (ref 7–25)
CO2: 27 mmol/L (ref 20–32)
Calcium: 8.9 mg/dL (ref 8.6–10.3)
Chloride: 98 mmol/L (ref 98–110)
Creat: 0.91 mg/dL (ref 0.60–1.35)
GFR, Est African American: 114 mL/min/{1.73_m2} (ref >=60)
GFR, Est Non African American: 99 mL/min/{1.73_m2} (ref >=60)
Globulin: 3 g/dL (calc) (ref 1.9–3.7)
Glucose, Bld: 316 mg/dL — ABNORMAL HIGH (ref 65–99)
Potassium: 4.4 mmol/L (ref 3.5–5.3)
Sodium: 134 mmol/L — ABNORMAL LOW (ref 135–146)
Total Bilirubin: 0.2 mg/dL (ref 0.2–1.2)
Total Protein: 6.6 g/dL (ref 6.1–8.1)

## 2016-12-11 LAB — CBC
HEMATOCRIT: 36 % — AB (ref 38.5–50.0)
Hemoglobin: 12.1 g/dL — ABNORMAL LOW (ref 13.2–17.1)
MCH: 29.2 pg (ref 27.0–33.0)
MCHC: 33.6 g/dL (ref 32.0–36.0)
MCV: 87 fL (ref 80.0–100.0)
MPV: 10.9 fL (ref 7.5–12.5)
PLATELETS: 255 10*3/uL (ref 140–400)
RBC: 4.14 10*6/uL — ABNORMAL LOW (ref 4.20–5.80)
RDW: 14.2 % (ref 11.0–15.0)
WBC: 5.3 10*3/uL (ref 3.8–10.8)

## 2016-12-12 LAB — T-HELPER CELL (CD4) - (RCID CLINIC ONLY)
CD4 % Helper T Cell: 31 % — ABNORMAL LOW (ref 33–55)
CD4 T Cell Abs: 420 /uL (ref 400–2700)

## 2016-12-13 LAB — HIV-1 RNA QUANT-NO REFLEX-BLD
HIV 1 RNA QUANT: DETECTED {copies}/mL — AB
HIV-1 RNA Quant, Log: <1.3 Log copies/mL — AB

## 2016-12-25 ENCOUNTER — Ambulatory Visit: Payer: Medicaid Other | Admitting: Infectious Diseases

## 2016-12-31 ENCOUNTER — Ambulatory Visit: Payer: Medicaid Other | Admitting: Infectious Diseases

## 2017-01-17 ENCOUNTER — Other Ambulatory Visit: Payer: Self-pay | Admitting: Infectious Diseases

## 2017-01-17 DIAGNOSIS — B20 Human immunodeficiency virus [HIV] disease: Secondary | ICD-10-CM

## 2017-01-17 DEATH — deceased

## 2017-01-19 ENCOUNTER — Telehealth: Payer: Self-pay | Admitting: *Deleted

## 2017-01-19 NOTE — Telephone Encounter (Signed)
Patient requesting Gabapentin 600mg  twice daily refill. He has been prescribed Gabapentin 600 mg three times daily as well. Please clarify which prescription is correct. Landis Gandy, RN

## 2017-01-19 NOTE — Telephone Encounter (Signed)
Bid is fine if that is his request thanks

## 2017-01-20 ENCOUNTER — Telehealth: Payer: Self-pay | Admitting: *Deleted

## 2017-01-20 NOTE — Telephone Encounter (Signed)
Notified by [facility] that patient died on 2023/01/20, case manager was at his funeral.  Will notify [facility] as well. Unsure how to mark chart as deceased. Upcoming appointments cancelled. Landis Gandy, RN

## 2017-09-10 IMAGING — DX DG CHEST 2V
2 series · 2 of 2 positions shown · non-contrast
Comparison: Overlapping portions of CT abdomen 12/16/2015

CLINICAL DATA: Oral [place].  Chest pain.  HIV.

EXAM:
CHEST  2 VIEW

[chest pa]
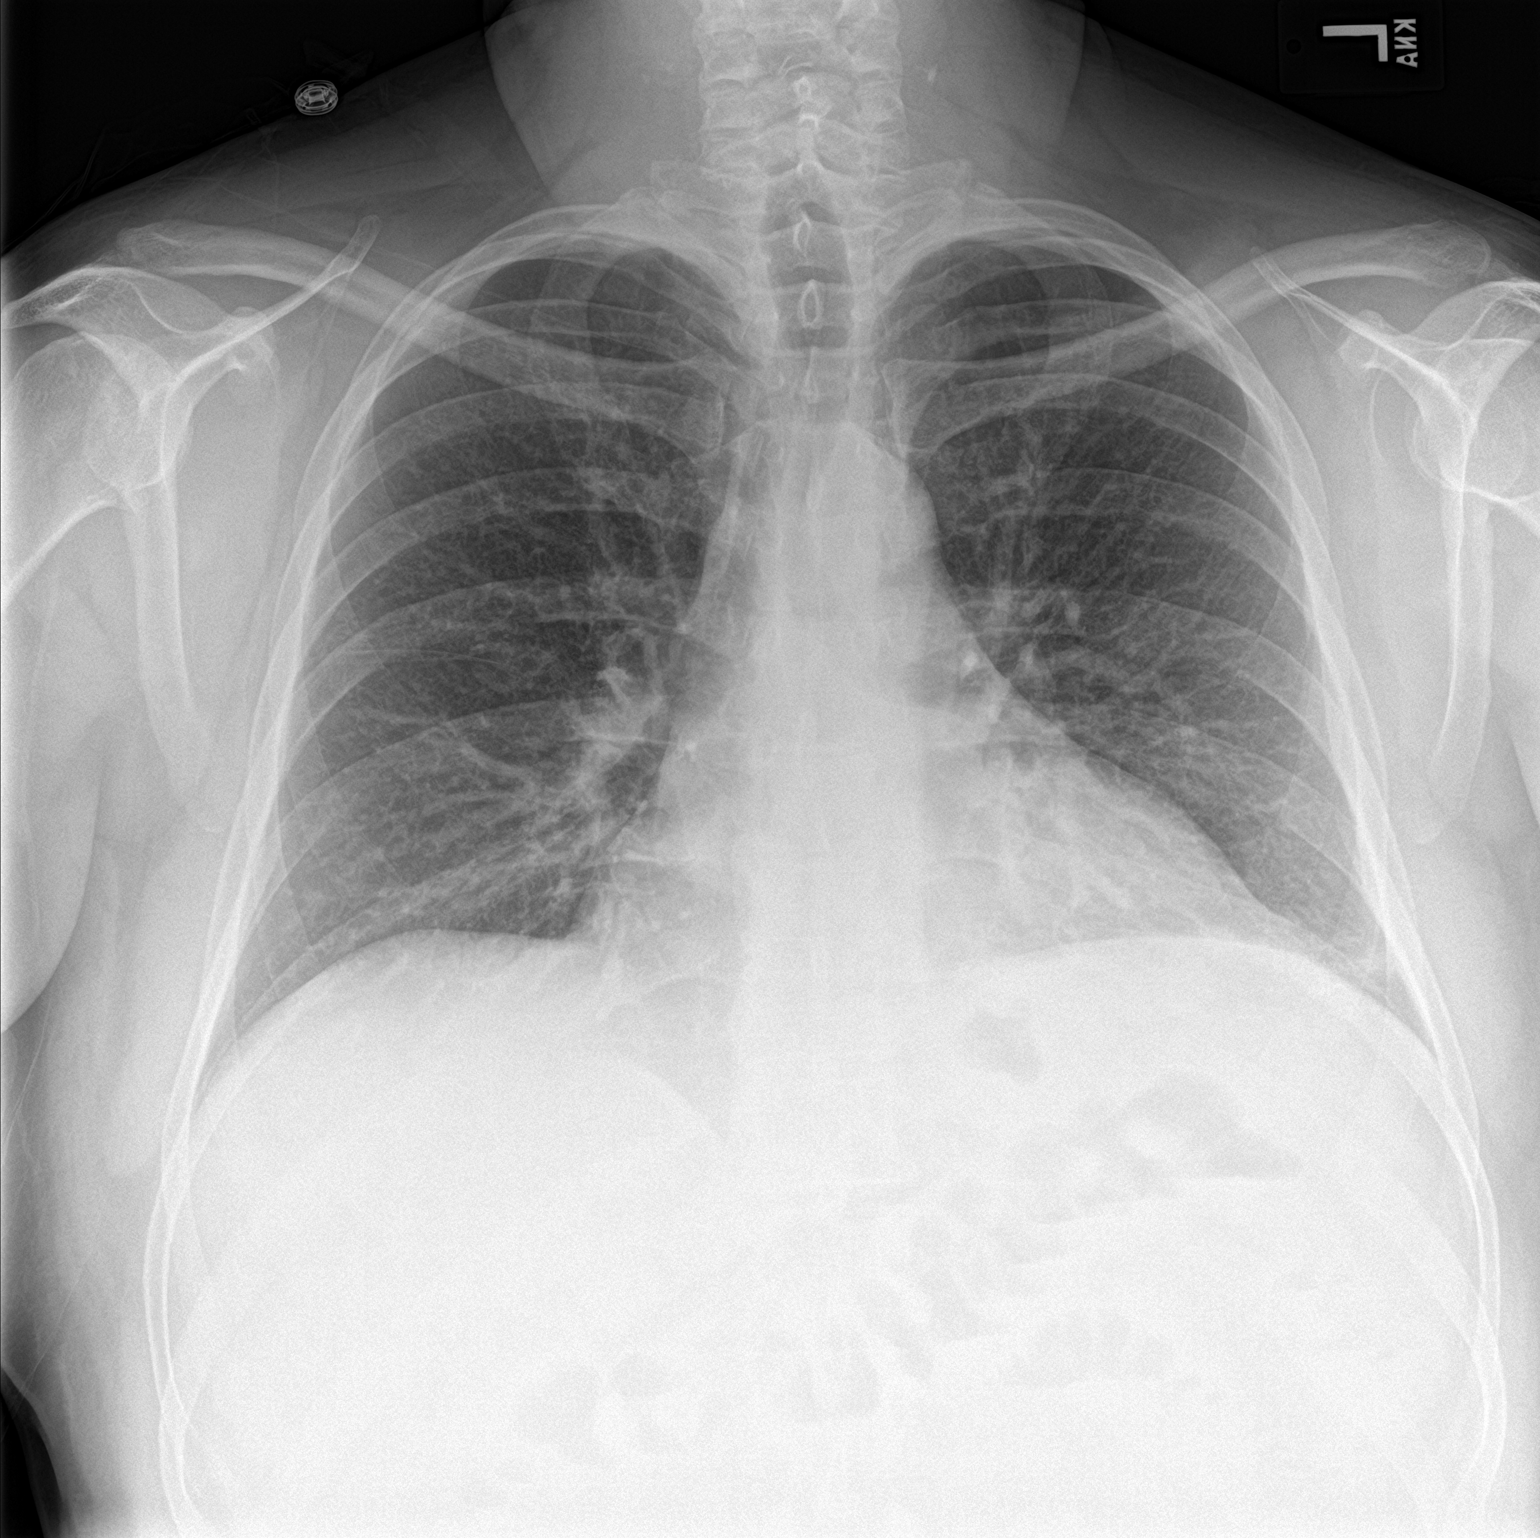

[chest lat]
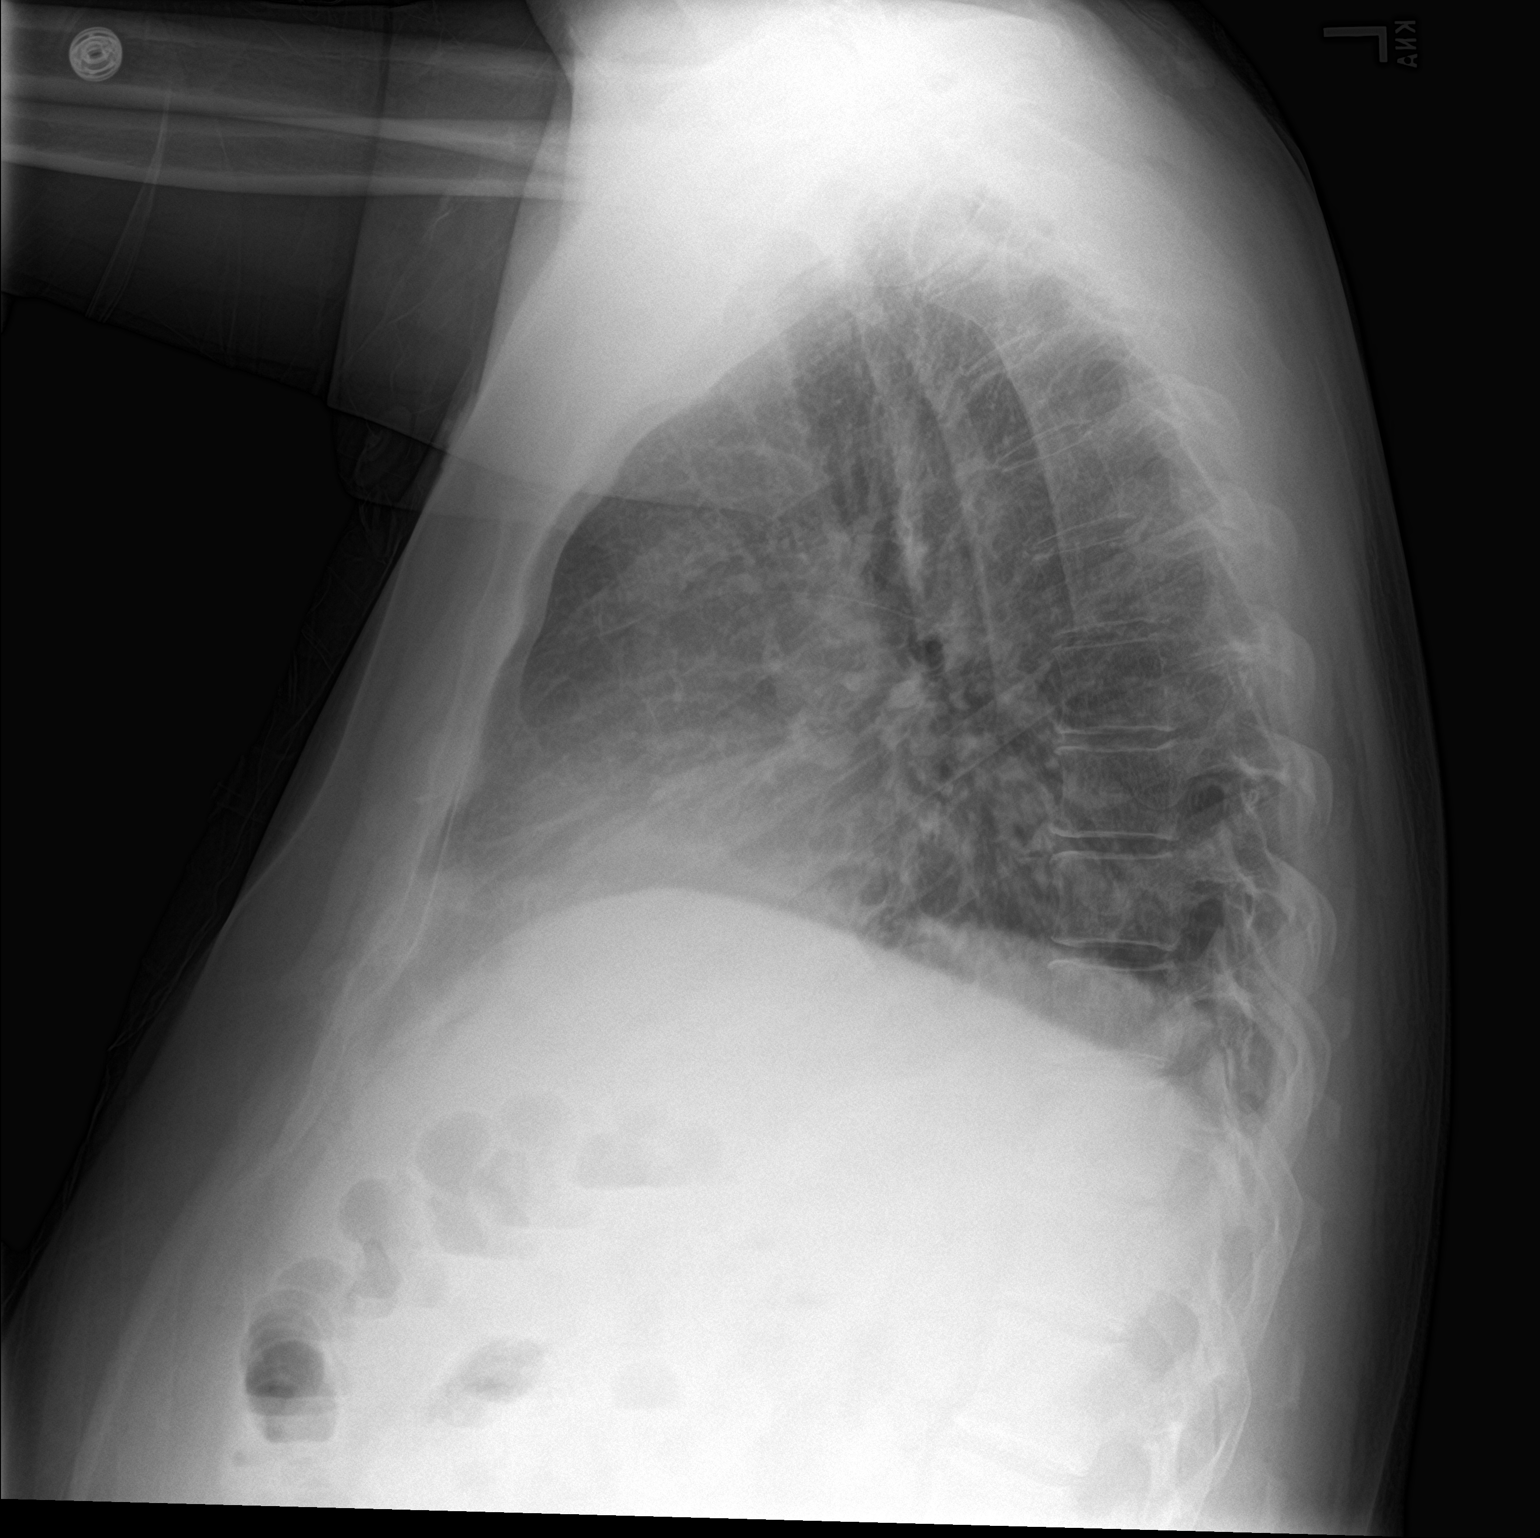

[2 of 2 positions shown; findings below may reference images not displayed]

FINDINGS: Mildly low lung volumes. Even compensating for this, there appears
to be new mild cardiomegaly. Linear subsegmental atelectasis or
scarring at the left lung base. Lateral projection blurred by motion
artifact especially around the lung bases.

The lungs appear otherwise clear.
IMPRESSION: 1. Mild enlargement of the cardiopericardial silhouette which
appears to be new. Some of this accentuation of cardiac size is
probably from low lung volumes.
2. Faint opacity at the left lung base favoring subsegmental
atelectasis.
